# Patient Record
Sex: Male | Born: 1950 | Race: White | Hispanic: No | Marital: Married | Smoking: Former smoker
Health system: Southern US, Community
[De-identification: ages and names within clinical notes are randomized; demographics above are authoritative.]

## PROBLEM LIST (undated history)

## (undated) DIAGNOSIS — C4490 Unspecified malignant neoplasm of skin, unspecified: Secondary | ICD-10-CM

## (undated) DIAGNOSIS — I10 Essential (primary) hypertension: Secondary | ICD-10-CM

## (undated) DIAGNOSIS — N200 Calculus of kidney: Secondary | ICD-10-CM

## (undated) DIAGNOSIS — F419 Anxiety disorder, unspecified: Secondary | ICD-10-CM

## (undated) HISTORY — PX: OTHER SURGICAL HISTORY: SHX169

## (undated) HISTORY — DX: Unspecified malignant neoplasm of skin, unspecified: C44.90

## (undated) HISTORY — PX: NASAL FRACTURE SURGERY: SHX718

## (undated) HISTORY — PX: TONSILLECTOMY: SUR1361

## (undated) HISTORY — PX: APPENDECTOMY: SHX54

## (undated) HISTORY — PX: CHOLECYSTECTOMY: SHX55

---

## 2004-10-24 ENCOUNTER — Other Ambulatory Visit: Payer: Self-pay

## 2004-11-01 ENCOUNTER — Ambulatory Visit: Payer: Self-pay | Admitting: Unknown Physician Specialty

## 2010-06-24 ENCOUNTER — Emergency Department: Payer: Self-pay | Admitting: Emergency Medicine

## 2011-12-17 ENCOUNTER — Ambulatory Visit: Payer: Self-pay | Admitting: Internal Medicine

## 2012-02-18 ENCOUNTER — Emergency Department: Payer: Self-pay

## 2012-02-18 LAB — URINALYSIS, COMPLETE
Bilirubin,UR: NEGATIVE
Blood: NEGATIVE
Glucose,UR: NEGATIVE mg/dL (ref 0–75)
Leukocyte Esterase: NEGATIVE
Ph: 6 (ref 4.5–8.0)
Protein: NEGATIVE
Squamous Epithelial: NONE SEEN
WBC UR: <1 /HPF (ref 0–5)

## 2012-02-18 LAB — CBC
HCT: 50.7 % (ref 40.0–52.0)
MCHC: 34.2 g/dL (ref 32.0–36.0)
RBC: 5.58 10*6/uL (ref 4.40–5.90)
RDW: 12.6 % (ref 11.5–14.5)
WBC: 11.9 10*3/uL — ABNORMAL HIGH (ref 3.8–10.6)

## 2012-02-18 LAB — COMPREHENSIVE METABOLIC PANEL
Alkaline Phosphatase: 82 U/L (ref 50–136)
Chloride: 102 mmol/L (ref 98–107)
EGFR (Non-African Amer.): >60
Glucose: 130 mg/dL — ABNORMAL HIGH (ref 65–99)
Potassium: 4.8 mmol/L (ref 3.5–5.1)
SGOT(AST): 39 U/L — ABNORMAL HIGH (ref 15–37)
Total Protein: 8.1 g/dL (ref 6.4–8.2)

## 2016-10-02 ENCOUNTER — Observation Stay
Admission: EM | Admit: 2016-10-02 | Discharge: 2016-10-03 | Disposition: A | Discharge status: Home / Self Care | Payer: 59 | Attending: Internal Medicine | Admitting: Internal Medicine

## 2016-10-02 ENCOUNTER — Emergency Department: Payer: 59

## 2016-10-02 ENCOUNTER — Encounter: Payer: Self-pay | Admitting: Emergency Medicine

## 2016-10-02 DIAGNOSIS — I10 Essential (primary) hypertension: Secondary | ICD-10-CM | POA: Diagnosis not present

## 2016-10-02 DIAGNOSIS — Z87442 Personal history of urinary calculi: Secondary | ICD-10-CM | POA: Diagnosis not present

## 2016-10-02 DIAGNOSIS — K219 Gastro-esophageal reflux disease without esophagitis: Secondary | ICD-10-CM | POA: Diagnosis not present

## 2016-10-02 DIAGNOSIS — F419 Anxiety disorder, unspecified: Secondary | ICD-10-CM | POA: Insufficient documentation

## 2016-10-02 DIAGNOSIS — Z880 Allergy status to penicillin: Secondary | ICD-10-CM | POA: Insufficient documentation

## 2016-10-02 DIAGNOSIS — K5732 Diverticulitis of large intestine without perforation or abscess without bleeding: Secondary | ICD-10-CM | POA: Diagnosis not present

## 2016-10-02 DIAGNOSIS — F329 Major depressive disorder, single episode, unspecified: Secondary | ICD-10-CM | POA: Diagnosis not present

## 2016-10-02 DIAGNOSIS — K5792 Diverticulitis of intestine, part unspecified, without perforation or abscess without bleeding: Secondary | ICD-10-CM | POA: Diagnosis present

## 2016-10-02 HISTORY — DX: Essential (primary) hypertension: I10

## 2016-10-02 HISTORY — DX: Anxiety disorder, unspecified: F41.9

## 2016-10-02 HISTORY — DX: Calculus of kidney: N20.0

## 2016-10-02 LAB — CBC WITH DIFFERENTIAL/PLATELET
BASOS ABS: 0.1 10*3/uL (ref 0–0.1)
Basophils Relative: 0 %
EOS PCT: 1 %
Eosinophils Absolute: 0.1 10*3/uL (ref 0–0.7)
HCT: 44.1 % (ref 40.0–52.0)
HEMOGLOBIN: 15.2 g/dL (ref 13.0–18.0)
LYMPHS ABS: 1.7 10*3/uL (ref 1.0–3.6)
LYMPHS PCT: 12 %
MCH: 30.8 pg (ref 26.0–34.0)
MCHC: 34.5 g/dL (ref 32.0–36.0)
MCV: 89.3 fL (ref 80.0–100.0)
Monocytes Absolute: 1.7 10*3/uL — ABNORMAL HIGH (ref 0.2–1.0)
Monocytes Relative: 12 %
NEUTROS ABS: 10.3 10*3/uL — AB (ref 1.4–6.5)
NEUTROS PCT: 75 %
PLATELETS: 218 10*3/uL (ref 150–440)
RBC: 4.94 MIL/uL (ref 4.40–5.90)
RDW: 12.6 % (ref 11.5–14.5)
WBC: 13.7 10*3/uL — AB (ref 3.8–10.6)

## 2016-10-02 LAB — BASIC METABOLIC PANEL
ANION GAP: 9 (ref 5–15)
BUN: 21 mg/dL — ABNORMAL HIGH (ref 6–20)
CHLORIDE: 102 mmol/L (ref 101–111)
CO2: 24 mmol/L (ref 22–32)
Calcium: 9.3 mg/dL (ref 8.9–10.3)
Creatinine, Ser: 1.15 mg/dL (ref 0.61–1.24)
GFR calc Af Amer: >60 mL/min (ref >60)
GLUCOSE: 114 mg/dL — AB (ref 65–99)
POTASSIUM: 3.6 mmol/L (ref 3.5–5.1)
Sodium: 135 mmol/L (ref 135–145)

## 2016-10-02 LAB — URINALYSIS COMPLETE WITH MICROSCOPIC (ARMC ONLY)
BACTERIA UA: NONE SEEN
Bilirubin Urine: NEGATIVE
GLUCOSE, UA: 150 mg/dL — AB
Hgb urine dipstick: NEGATIVE
Leukocytes, UA: NEGATIVE
Nitrite: NEGATIVE
PROTEIN: 30 mg/dL — AB
SPECIFIC GRAVITY, URINE: 1.024 (ref 1.005–1.030)
pH: 7 (ref 5.0–8.0)

## 2016-10-02 MED ORDER — MORPHINE SULFATE (PF) 4 MG/ML IV SOLN
4.0000 mg | Freq: Once | INTRAVENOUS | Status: AC
  Administered 2016-10-02: 4 mg via INTRAVENOUS
  Filled 2016-10-02: qty 1

## 2016-10-02 MED ORDER — ONDANSETRON HCL 4 MG/2ML IJ SOLN
INTRAMUSCULAR | Status: AC
  Administered 2016-10-02: 4 mg via INTRAVENOUS
  Filled 2016-10-02: qty 2

## 2016-10-02 MED ORDER — PAROXETINE HCL 20 MG PO TABS
30.0000 mg | ORAL_TABLET | Freq: Every day | ORAL | Status: DC
  Administered 2016-10-03: 09:00:00 30 mg via ORAL
  Filled 2016-10-02: qty 2

## 2016-10-02 MED ORDER — SODIUM CHLORIDE 0.9 % IV BOLUS (SEPSIS)
500.0000 mL | Freq: Once | INTRAVENOUS | Status: AC
Start: 2016-10-02 — End: 2016-10-02
  Administered 2016-10-02: 500 mL via INTRAVENOUS

## 2016-10-02 MED ORDER — ACETAMINOPHEN 650 MG RE SUPP: 650.0000 mg | Freq: Four times a day (QID) | RECTAL | Status: DC | PRN

## 2016-10-02 MED ORDER — ONDANSETRON HCL 4 MG/2ML IJ SOLN: 4.0000 mg | Freq: Four times a day (QID) | INTRAMUSCULAR | Status: DC | PRN

## 2016-10-02 MED ORDER — ONDANSETRON HCL 4 MG PO TABS: 4.0000 mg | ORAL_TABLET | Freq: Four times a day (QID) | ORAL | Status: DC | PRN

## 2016-10-02 MED ORDER — SODIUM CHLORIDE 0.9 % IV SOLN
INTRAVENOUS | Status: DC
  Administered 2016-10-02: via INTRAVENOUS

## 2016-10-02 MED ORDER — ACETAMINOPHEN 325 MG PO TABS
650.0000 mg | ORAL_TABLET | Freq: Four times a day (QID) | ORAL | Status: DC | PRN
  Administered 2016-10-03: 650 mg via ORAL
  Filled 2016-10-02: qty 2

## 2016-10-02 MED ORDER — SODIUM CHLORIDE 0.9 % IV BOLUS (SEPSIS)
500.0000 mL | Freq: Once | INTRAVENOUS | Status: AC
  Administered 2016-10-02: 500 mL via INTRAVENOUS

## 2016-10-02 MED ORDER — PANTOPRAZOLE SODIUM 40 MG PO TBEC
40.0000 mg | DELAYED_RELEASE_TABLET | Freq: Every day | ORAL | Status: DC
  Administered 2016-10-03: 09:00:00 40 mg via ORAL
  Filled 2016-10-02: qty 1

## 2016-10-02 MED ORDER — CIPROFLOXACIN IN D5W 400 MG/200ML IV SOLN
400.0000 mg | Freq: Two times a day (BID) | INTRAVENOUS | Status: DC
  Filled 2016-10-02 (×2): qty 200

## 2016-10-02 MED ORDER — MORPHINE SULFATE (PF) 4 MG/ML IV SOLN: 4.0000 mg | Freq: Once | INTRAVENOUS | Status: DC

## 2016-10-02 MED ORDER — ONDANSETRON HCL 4 MG/2ML IJ SOLN
4.0000 mg | Freq: Once | INTRAMUSCULAR | Status: AC
  Administered 2016-10-02: 4 mg via INTRAVENOUS

## 2016-10-02 MED ORDER — ENOXAPARIN SODIUM 40 MG/0.4ML ~~LOC~~ SOLN: 40.0000 mg | SUBCUTANEOUS | Status: DC

## 2016-10-02 MED ORDER — METRONIDAZOLE IN NACL 5-0.79 MG/ML-% IV SOLN
500.0000 mg | Freq: Three times a day (TID) | INTRAVENOUS | Status: DC
  Administered 2016-10-03: 05:00:00 500 mg via INTRAVENOUS
  Filled 2016-10-02 (×3): qty 100

## 2016-10-02 MED ORDER — IRBESARTAN 150 MG PO TABS
150.0000 mg | ORAL_TABLET | Freq: Every day | ORAL | Status: DC
  Administered 2016-10-03: 09:00:00 150 mg via ORAL
  Filled 2016-10-02: qty 1

## 2016-10-02 MED ORDER — METRONIDAZOLE IN NACL 5-0.79 MG/ML-% IV SOLN
500.0000 mg | Freq: Once | INTRAVENOUS | Status: AC
  Administered 2016-10-02: 500 mg via INTRAVENOUS
  Filled 2016-10-02: qty 100

## 2016-10-02 MED ORDER — KETOROLAC TROMETHAMINE 30 MG/ML IJ SOLN
15.0000 mg | Freq: Once | INTRAMUSCULAR | Status: AC
  Administered 2016-10-02: 15 mg via INTRAVENOUS
  Filled 2016-10-02: qty 1

## 2016-10-02 MED ORDER — MORPHINE SULFATE (PF) 4 MG/ML IV SOLN: 2.0000 mg | INTRAVENOUS | Status: DC | PRN

## 2016-10-02 MED ORDER — PHENAZOPYRIDINE HCL 200 MG PO TABS
200.0000 mg | ORAL_TABLET | Freq: Once | ORAL | Status: AC
  Administered 2016-10-02: 200 mg via ORAL
  Filled 2016-10-02: qty 1

## 2016-10-02 MED ORDER — OXYCODONE HCL 5 MG PO TABS: 5.0000 mg | ORAL_TABLET | ORAL | Status: DC | PRN

## 2016-10-02 MED ORDER — CIPROFLOXACIN IN D5W 400 MG/200ML IV SOLN
400.0000 mg | Freq: Once | INTRAVENOUS | Status: AC
  Administered 2016-10-02: 400 mg via INTRAVENOUS
  Filled 2016-10-02: qty 200

## 2016-10-02 NOTE — ED Triage Notes (Signed)
Past a kidney stone yesterday , fever 102 today , dysuria, bladder spasms today, " it made me double over"

## 2016-10-02 NOTE — ED Notes (Signed)
Pt lying on stretcher, mouth breathing, tachypneic.   Tammy EDT performing EKG.   Pt passed kidney stone yesterday, denies blood in urine. States hurts to pee and having bladder spasm, had fever at home. Pt tensing up while talking and moaning.

## 2016-10-02 NOTE — ED Provider Notes (Signed)
[facility] Emergency Department Provider Note  ____________________________________________   I have reviewed the triage vital signs and the nursing notes.   HISTORY  Chief Complaint Dysuria    HPI Mason Rodgers is a 65 y.o. male presents today complaining of left-sided and suprapubic abdominal pain. Patient believes this is because of a kidney stone. Patient states he believes he may have passed a kidney stone the day before yesterday. Since that time he has had a gradually worsening abdominal pain. No fever, positive nausea but no vomiting. He has not had any significant dysuria but he has when he wheezes pain around his bladder. He is not grossly retaining. At her scan here shows less than 200 cc.      Past Medical History:  Diagnosis Date  . Anxiety   . Cancer ([place])   . Hypertension   . Kidney stone     There are no active problems to display for this patient.   Past Surgical History:  Procedure Laterality Date  . APPENDECTOMY    . CHOLECYSTECTOMY      Prior to Admission medications   Not on File    Allergies Penicillins  No family history on file.  Social History Social History  Substance Use Topics  . Smoking status: Former Research scientist (life sciences)  . Smokeless tobacco: Never Used  . Alcohol use Yes    Review of Systems Constitutional: No fever/chills Eyes: No visual changes. ENT: No sore throat. No stiff neck no neck pain Cardiovascular: Denies chest pain. Respiratory: Denies shortness of breath. Gastrointestinal:   no vomiting.  No diarrhea.  No constipation. Genitourinary: Negative for dysuria. Musculoskeletal: Negative lower extremity swelling Skin: Negative for rash. Neurological: Negative for severe headaches, focal weakness or numbness. 10-point ROS otherwise negative.  ____________________________________________   PHYSICAL EXAM:  VITAL SIGNS: ED Triage Vitals  Enc Vitals Group     BP 10/02/16 1813 (!) 143/74     Pulse  Rate 10/02/16 1813 (!) 111     Resp 10/02/16 1813 18     Temp 10/02/16 1813 99.5 F (37.5 C)     Temp Source 10/02/16 1813 Oral     SpO2 10/02/16 1813 100 %     Weight 10/02/16 1814 171 lb (77.6 kg)     Height 10/02/16 1814 5\' 6"  (1.676 m)     Head Circumference --      Peak Flow --      Pain Score 10/02/16 1814 8     Pain Loc --      Pain Edu? --      Excl. in [place]? --     Constitutional: Alert and oriented. Well appearing and in no acute distress.Ears somewhat uncomfortable Eyes: Conjunctivae are normal. PERRL. EOMI. Head: Atraumatic. Nose: No congestion/rhinnorhea. Mouth/Throat: Mucous membranes are moist.  Oropharynx non-erythematous. Neck: No stridor.   Nontender with no meningismus Cardiovascular: Normal rate, regular rhythm. Grossly normal heart sounds.  Good peripheral circulation. Respiratory: Normal respiratory effort.  No retractions. Lungs CTAB. Abdominal: Soft and Diffusely tender with tenderness to palpation suprapubic and the left lower quadrant. No distention. No guarding no rebound Back:  There is no focal tenderness or step off.  there is no midline tenderness there are no lesions noted. there is no CVA tenderness Musculoskeletal: No lower extremity tenderness, no upper extremity tenderness. No joint effusions, no DVT signs strong distal pulses no edema Neurologic:  Normal speech and language. No gross focal neurologic deficits are appreciated.  Skin:  Skin is warm,  dry and intact. No rash noted. Psychiatric: Mood and affect are normal. Speech and behavior are normal.  ____________________________________________   LABS (all labs ordered are listed, but only abnormal results are displayed)  Labs Reviewed  CBC WITH DIFFERENTIAL/PLATELET - Abnormal; Notable for the following:       Result Value   WBC 13.7 (*)    Neutro Abs 10.3 (*)    Monocytes Absolute 1.7 (*)    All other components within normal limits  BASIC METABOLIC PANEL - Abnormal; Notable for the  following:    Glucose, Bld 114 (*)    BUN 21 (*)    All other components within normal limits  URINE CULTURE  URINALYSIS COMPLETEWITH MICROSCOPIC ([facility] ONLY)   ____________________________________________  EKG  I personally interpreted any EKGs ordered by me or triage Sinus tach rate 109 no acute ST elevation or depression normal axis ____________________________________________  RADIOLOGY  I reviewed any imaging ordered by me or triage that were performed during my shift and, if possible, patient and/or family made aware of any abnormal findings. ____________________________________________   PROCEDURES  Procedure(s) performed: None  Procedures  Critical Care performed: None  ____________________________________________   INITIAL IMPRESSION / ASSESSMENT AND PLAN / ED COURSE  Pertinent labs & imaging results that were available during my care of the patient were reviewed by me and considered in my medical decision making (see chart for details).  Was adamantly convinced that this was a sequelae of a kidney stone however, his exam was somewhat on usual for that, urinalysis is pending CT scan however does show acute diverticulitis. We will treat him with antibiotics, no evidence of perforation radiology no free air. We will continue to give him pain medication. We did initially give him Toradol as he had a very strong history of kidney stone and he felt much better. However, we will transition to more narcotic pain medication for this as he is still somewhat uncomfortable. The morphine did help his pain initially however.  Clinical Course    ____________________________________________   FINAL CLINICAL IMPRESSION(S) / ED DIAGNOSES  Final diagnoses:  None      This chart was dictated using voice recognition software.  Despite best efforts to proofread,  errors can occur which can change meaning.      Schuyler Amor, MD 10/02/16 2032

## 2016-10-02 NOTE — H&P (Signed)
[place] [facility] [place] NAME: Janet Szwed    MR#:  [id number]  DATE OF BIRTH:  12/12/1950   DATE OF ADMISSION:  10/02/2016  PRIMARY CARE PHYSICIAN: Madelyn Brunner, MD   REQUESTING/REFERRING PHYSICIAN: Burlene Arnt  CHIEF COMPLAINT:   Flank pain  HISTORY OF PRESENT ILLNESS:  Quency Kitzinger  is a 65 y.o. male with a known history of Essential hypertension who is presenting with flank pain. He states he passed a kidney stone yesterday but pain has been worsening thus presented to Hospital further workup and evaluation. He describes left lower quadrant pain described only as "pain" intensity 7/10 no worsening or relieving factors no radiation associated with some nauseousness denies any vomiting diarrhea [place] for chills and subjective fever.  PAST MEDICAL HISTORY:   Past Medical History:  Diagnosis Date  . Anxiety   . Cancer ([place])   . Hypertension   . Kidney stone     PAST SURGICAL HISTORY:   Past Surgical History:  Procedure Laterality Date  . APPENDECTOMY    . CHOLECYSTECTOMY      SOCIAL HISTORY:   Social History  Substance Use Topics  . Smoking status: Former Research scientist (life sciences)  . Smokeless tobacco: Never Used  . Alcohol use Yes    FAMILY HISTORY:   Family History  Problem Relation Age of Onset  . Hypertension Other     DRUG ALLERGIES:   Allergies  Allergen Reactions  . Penicillins Anaphylaxis    Has patient had a PCN reaction causing immediate rash, facial/tongue/throat swelling, SOB or lightheadedness with hypotension: Yes Has patient had a PCN reaction causing severe rash involving mucus membranes or skin necrosis: No Has patient had a PCN reaction that required hospitalization Yes Has patient had a PCN reaction occurring within the last 10 years: No If all of the above answers are "NO", then may proceed with Cephalosporin use.     REVIEW OF SYSTEMS:  REVIEW OF SYSTEMS:  CONSTITUTIONAL: Positive fevers, chills,  fatigue, weakness.  EYES: Denies blurred vision, double vision, or eye pain.  EARS, NOSE, THROAT: Denies tinnitus, ear pain, hearing loss.  RESPIRATORY: denies cough, shortness of breath, wheezing  CARDIOVASCULAR: Denies chest pain, palpitations, edema.  GASTROINTESTINAL: Positive nausea, abdominal pain.  GENITOURINARY: Denies dysuria, hematuria.  ENDOCRINE: Denies nocturia or thyroid problems. HEMATOLOGIC AND LYMPHATIC: Denies easy bruising or bleeding.  SKIN: Denies rash or lesions.  MUSCULOSKELETAL: Denies pain in neck, back, shoulder, knees, hips, or further arthritic symptoms.  NEUROLOGIC: Denies paralysis, paresthesias.  PSYCHIATRIC: Denies anxiety or depressive symptoms. Otherwise full review of systems performed by me is negative.   MEDICATIONS AT HOME:   Prior to Admission medications   Medication Sig Start Date End Date Taking? Authorizing Provider  ibuprofen (ADVIL,MOTRIN) 200 MG tablet Take 600 mg by mouth 2 (two) times daily.   Yes Historical Provider, MD  olmesartan (BENICAR) 20 MG tablet Take 20 mg by mouth daily.   Yes Historical Provider, MD  omeprazole (PRILOSEC) 20 MG capsule Take 20 mg by mouth daily.   Yes Historical Provider, MD  PARoxetine (PAXIL) 30 MG tablet Take 30 mg by mouth daily.   Yes Historical Provider, MD      VITAL SIGNS:  Blood pressure (!) 143/86, pulse (!) 112, temperature 99.5 F (37.5 C), temperature source Oral, resp. rate (!) 21, height 5\' 6"  (1.676 m), weight 77.6 kg (171 lb), SpO2 100 %.  PHYSICAL EXAMINATION:  VITAL SIGNS: Vitals:   10/02/16 1900 10/02/16  1930  BP: 130/79 (!) 143/86  Pulse: 96 (!) 112  Resp: (!) 21 (!) 21  Temp:     GENERAL:64 y.o.male currently in no acute distress.  HEAD: Normocephalic, atraumatic.  EYES: Pupils equal, round, reactive to light. Extraocular muscles intact. No scleral icterus.  MOUTH: Moist mucosal membrane. Dentition intact. No abscess noted.  EAR, NOSE, THROAT: Clear without exudates. No  external lesions.  NECK: Supple. No thyromegaly. No nodules. No JVD.  PULMONARY: Clear to ascultation, without wheeze rails or rhonci. No use of accessory muscles, Good respiratory effort. good air entry bilaterally CHEST: Nontender to palpation.  CARDIOVASCULAR: S1 and S2. Regular rate and rhythm. No murmurs, rubs, or gallops. No edema. Pedal pulses 2+ bilaterally.  GASTROINTESTINAL: Soft, Positive left lower quadrant tenderness without rebound, nondistended. No masses. Positive bowel sounds. No hepatosplenomegaly.  MUSCULOSKELETAL: No swelling, clubbing, or edema. Range of motion full in all extremities.  NEUROLOGIC: Cranial nerves II through XII are intact. No gross focal neurological deficits. Sensation intact. Reflexes intact.  SKIN: No ulceration, lesions, rashes, or cyanosis. Skin warm and dry. Turgor intact.  PSYCHIATRIC: Mood, affect within normal limits. The patient is awake, alert and oriented x 3. Insight, judgment intact.    LABORATORY PANEL:   CBC  Recent Labs Lab 10/02/16 1838  WBC 13.7*  HGB 15.2  HCT 44.1  PLT 218   ------------------------------------------------------------------------------------------------------------------  Chemistries   Recent Labs Lab 10/02/16 1838  NA 135  K 3.6  CL 102  CO2 24  GLUCOSE 114*  BUN 21*  CREATININE 1.15  CALCIUM 9.3   ------------------------------------------------------------------------------------------------------------------  Cardiac Enzymes No results for input(s): TROPONINI in the last 168 hours. ------------------------------------------------------------------------------------------------------------------  RADIOLOGY:  Ct Renal Stone Study  Result Date: 10/02/2016 CLINICAL DATA:  Initial evaluation for kidney stones. EXAM: CT ABDOMEN AND PELVIS WITHOUT CONTRAST TECHNIQUE: Multidetector CT imaging of the abdomen and pelvis was performed following the standard protocol without IV contrast. COMPARISON:   Prior CT from 12/17/2011. FINDINGS: Lower chest: Mild subsegmental atelectasis seen dependently within the visualized lung bases. Visualized lungs are otherwise clear. 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 Hepatobiliary: The liver demonstrates a normal unenhanced appearance. Gallbladder surgically absent. No biliary dilatation. Pancreas: Pancreas within normal limits. Spleen: Subcentimeter hypodensity noted within the anterior aspect of the spleen, not well characterized on this noncontrast examination, but of doubtful clinical significance. Spleen otherwise unremarkable. Adrenals/Urinary Tract: Adrenal glands are normal. Few scattered punctate calcific densities present within the kidneys bilaterally, suspicious for small nonobstructive calculi. No radiopaque stones present along the course of either ureter. There is no hydroureter. Small bilateral diverticula noted at the UVJs bilaterally. No radiopaque stones seen within the bladder lumen. Bladder itself within normal limits. Stomach/Bowel: Large hiatal hernia without associated inflammation. Stomach otherwise unremarkable. No evidence for bowel obstruction. Multiple sigmoid diverticula present. There is inflammatory stranding about several of these diverticular within the central/ left lower abdomen, compatible with acute diverticulitis. No other acute inflammatory changes seen about the bowels. Appendix not visualize, compatible with prior appendectomy. Vascular/Lymphatic: Aorta of normal caliber. Minimal aorto bi-iliac atherosclerotic disease. No adenopathy. Reproductive: Prostate normal. Other: No free intraperitoneal air. No free fluid. Small bilateral fat containing inguinal hernias noted. Musculoskeletal: No acute osseous abnormality. No worrisome lytic or blastic osseous lesions. IMPRESSION: 1. Findings consistent with acute sigmoid diverticulitis. No complication identified. 2. Probable small punctate nonobstructive nephrolithiasis  bilaterally. No CT evidence for obstructive uropathy. 3. No other acute intra-abdominal or pelvic process identified. Electronically Signed   By:  Jeannine Boga M.D.   On: 10/02/2016 20:18    EKG:   Orders placed or performed during the hospital encounter of 10/02/16  . EKG 12-Lead  . EKG 12-Lead    IMPRESSION AND PLAN:   65 year old Caucasian gentleman history of essential hypertension who is presenting with flank/abdominal pain.  1. Acute diverticulitis: IV fluid hydration, antibiotic ciprofloxacin/Flagyl, diet as tolerated 2. Essential hypertension Benicar 3. GERD without esophagitis PPI therapy Anxiety/depression not otherwise specified Paxil    All the records are reviewed and case discussed with ED provider. Management plans discussed with the patient, family and they are in agreement.  CODE STATUS: Full  TOTAL TIME TAKING CARE OF THIS PATIENT: 33 minutes.    Hower,  ELIJIO [email or web address].D on 10/02/2016 at 9:05 PM  Between 7am to 6pm - Pager - [phone]  After 6pm: House Pager: - [facility]  Office  [phone]  CC: Primary care physician; Madelyn Brunner, MD

## 2016-10-02 NOTE — ED Notes (Signed)
Pt urinated and states pain in penis and bladder still after urinating.

## 2016-10-02 NOTE — ED Notes (Signed)
Dr. Hower at bedside.  

## 2016-10-02 NOTE — ED Notes (Signed)
266ml on bladder scan, urinated 168ml

## 2016-10-03 DIAGNOSIS — K5792 Diverticulitis of intestine, part unspecified, without perforation or abscess without bleeding: Secondary | ICD-10-CM | POA: Diagnosis present

## 2016-10-03 LAB — BASIC METABOLIC PANEL
Anion gap: 7 (ref 5–15)
BUN: 19 mg/dL (ref 6–20)
CALCIUM: 7.8 mg/dL — AB (ref 8.9–10.3)
CHLORIDE: 105 mmol/L (ref 101–111)
CO2: 25 mmol/L (ref 22–32)
CREATININE: 1.06 mg/dL (ref 0.61–1.24)
GFR calc Af Amer: >60 mL/min (ref >60)
GFR calc non Af Amer: >60 mL/min (ref >60)
Glucose, Bld: 165 mg/dL — ABNORMAL HIGH (ref 65–99)
Potassium: 4 mmol/L (ref 3.5–5.1)
SODIUM: 137 mmol/L (ref 135–145)

## 2016-10-03 LAB — CBC
HCT: 40.8 % (ref 40.0–52.0)
Hemoglobin: 13.8 g/dL (ref 13.0–18.0)
MCH: 30.9 pg (ref 26.0–34.0)
MCHC: 33.8 g/dL (ref 32.0–36.0)
MCV: 91.6 fL (ref 80.0–100.0)
PLATELETS: 182 10*3/uL (ref 150–440)
RBC: 4.46 MIL/uL (ref 4.40–5.90)
RDW: 12.8 % (ref 11.5–14.5)
WBC: 14.1 10*3/uL — AB (ref 3.8–10.6)

## 2016-10-03 MED ORDER — METRONIDAZOLE 500 MG PO TABS: 500.0000 mg | ORAL_TABLET | Freq: Three times a day (TID) | ORAL | 0 refills | Status: DC

## 2016-10-03 MED ORDER — METRONIDAZOLE 500 MG PO TABS: 500.0000 mg | ORAL_TABLET | Freq: Three times a day (TID) | ORAL | Status: DC

## 2016-10-03 MED ORDER — CIPROFLOXACIN HCL 500 MG PO TABS: 500.0000 mg | ORAL_TABLET | Freq: Two times a day (BID) | ORAL | 0 refills | Status: DC

## 2016-10-03 MED ORDER — CIPROFLOXACIN HCL 500 MG PO TABS
500.0000 mg | ORAL_TABLET | Freq: Two times a day (BID) | ORAL | Status: DC
  Administered 2016-10-03: 500 mg via ORAL
  Filled 2016-10-03: qty 1

## 2016-10-03 NOTE — Discharge Instructions (Signed)
Soft easy to digest diet.   

## 2016-10-03 NOTE — Progress Notes (Signed)
Pt is being discharged today, discharge instructions given to pt, 2 paper prescriptions given to him. He verified understanding of all information. Pt belongings packed and returned to pt. He was rolled out in wheelchair by staff.

## 2016-10-03 NOTE — Discharge Summary (Signed)
[facility] [place] NAME: Mason Rodgers    MR#:  [id number]  DATE OF BIRTH:  02/02/1951  DATE OF ADMISSION:  10/02/2016 ADMITTING PHYSICIAN: Lytle Butte, MD  DATE OF DISCHARGE: 10/03/16  PRIMARY CARE PHYSICIAN: Madelyn Brunner, MD    ADMISSION DIAGNOSIS:  Diverticulitis of large intestine without perforation or abscess without bleeding [K57.32]  DISCHARGE DIAGNOSIS:  Acute Sigmoid Diverticulitis  SECONDARY DIAGNOSIS:   Past Medical History:  Diagnosis Date  . Anxiety   . Cancer ([place])   . Hypertension   . Kidney stone     HOSPITAL COURSE:  Mason Rodgers  is a 66 y.o. male with a known history of Essential hypertension who is presenting with flank pain. He states he passed a kidney stone yesterday but pain has been worsening thus presented to Hospital further workup and evaluation. He describes left lower quadrant pain described only as "pain" intensity 7/10 no worsening or relieving factors no radiation associated with some nauseousness denies any vomiting diarrhea   1. Acute Sigmoid diverticulitis:  -received IV fluid hydration, antibiotic ciprofloxacin/Flagyl, diet as tolerated -change to po abxs. Feels better than y'day. Tolerating po diet -change to po abxs total 10 days  2. Essential hypertension Benicar  3. GERD without esophagitis PPI therapy  4.Anxiety/depression not otherwise specified Paxil  Overall better. Pt agrees he will feel better at home.   CONSULTS OBTAINED:  Treatment Team:  Lytle Butte, MD  DRUG ALLERGIES:   Allergies  Allergen Reactions  . Penicillins Anaphylaxis    Has patient had a PCN reaction causing immediate rash, facial/tongue/throat swelling, SOB or lightheadedness with hypotension: Yes Has patient had a PCN reaction causing severe rash involving mucus membranes or skin necrosis: No Has patient had a PCN reaction that required hospitalization Yes Has patient had a PCN  reaction occurring within the last 10 years: No If all of the above answers are "NO", then may proceed with Cephalosporin use.     DISCHARGE MEDICATIONS:   Current Discharge Medication List    START taking these medications   Details  ciprofloxacin (CIPRO) 500 MG tablet Take 1 tablet (500 mg total) by mouth 2 (two) times daily. Qty: 18 tablet, Refills: 0    metroNIDAZOLE (FLAGYL) 500 MG tablet Take 1 tablet (500 mg total) by mouth every 8 (eight) hours. Qty: 27 tablet, Refills: 0      CONTINUE these medications which have NOT CHANGED   Details  ibuprofen (ADVIL,MOTRIN) 200 MG tablet Take 600 mg by mouth 2 (two) times daily.    olmesartan (BENICAR) 20 MG tablet Take 20 mg by mouth daily.    omeprazole (PRILOSEC) 20 MG capsule Take 20 mg by mouth daily.    PARoxetine (PAXIL) 30 MG tablet Take 30 mg by mouth daily.        If you experience worsening of your admission symptoms, develop shortness of breath, life threatening emergency, suicidal or homicidal thoughts you must seek medical attention immediately by calling 911 or calling your MD immediately  if symptoms less severe.  You Must read complete instructions/literature along with all the possible adverse reactions/side effects for all the Medicines you take and that have been prescribed to you. Take any new Medicines after you have completely understood and accept all the possible adverse reactions/side effects.   Please note  You were cared for by a hospitalist during your hospital stay. If you have any questions about your discharge medications or  the care you received while you were in the hospital after you are discharged, you can call the unit and asked to speak with the hospitalist on call if the hospitalist that took care of you is not available. Once you are discharged, your primary care physician will handle any further medical issues. Please note that NO REFILLS for any discharge medications will be authorized once  you are discharged, as it is imperative that you return to your primary care physician (or establish a relationship with a primary care physician if you do not have one) for your aftercare needs so that they can reassess your need for medications and monitor your lab values. Today   SUBJECTIVE   I feel better than y'day  VITAL SIGNS:  Blood pressure 107/62, pulse 96, temperature 99.3 F (37.4 C), temperature source Oral, resp. rate 20, height 5\' 6"  (1.676 m), weight 77.6 kg (171 lb), SpO2 97 %.  I/O:   Intake/Output Summary (Last 24 hours) at 10/03/16 0906 Last data filed at 10/03/16 0636  Gross per 24 hour  Intake           573.33 ml  Output              450 ml  Net           123.33 ml    PHYSICAL EXAMINATION:  GENERAL:  65 y.o.-year-old patient lying in the bed with no acute distress.  EYES: Pupils equal, round, reactive to light and accommodation. No scleral icterus. Extraocular muscles intact.  HEENT: Head atraumatic, normocephalic. Oropharynx and nasopharynx clear.  NECK:  Supple, no jugular venous distention. No thyroid enlargement, no tenderness.  LUNGS: Normal breath sounds bilaterally, no wheezing, rales,rhonchi or crepitation. No use of accessory muscles of respiration.  CARDIOVASCULAR: S1, S2 normal. No murmurs, rubs, or gallops.  ABDOMEN: Soft, non-tender, non-distended. Bowel sounds present. No organomegaly or mass.  EXTREMITIES: No pedal edema, cyanosis, or clubbing.  NEUROLOGIC: Cranial nerves II through XII are intact. Muscle strength 5/5 in all extremities. Sensation intact. Gait not checked.  PSYCHIATRIC: The patient is alert and oriented x 3.  SKIN: No obvious rash, lesion, or ulcer.   DATA REVIEW:   CBC   Recent Labs Lab 10/03/16 0332  WBC 14.1*  HGB 13.8  HCT 40.8  PLT 182    Chemistries   Recent Labs Lab 10/03/16 0332  NA 137  K 4.0  CL 105  CO2 25  GLUCOSE 165*  BUN 19  CREATININE 1.06  CALCIUM 7.8*    Microbiology Results   No  results found for this or any previous visit (from the past 240 hour(s)).  RADIOLOGY:  Ct Renal Stone Study  Result Date: 10/02/2016 CLINICAL DATA:  Initial evaluation for kidney stones. EXAM: CT ABDOMEN AND PELVIS WITHOUT CONTRAST TECHNIQUE: Multidetector CT imaging of the abdomen and pelvis was performed following the standard protocol without IV contrast. COMPARISON:  Prior CT from 12/17/2011. FINDINGS: Lower chest: Mild subsegmental atelectasis seen dependently within the visualized lung bases. Visualized lungs are otherwise clear. 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 Hepatobiliary: The liver demonstrates a normal unenhanced appearance. Gallbladder surgically absent. No biliary dilatation. Pancreas: Pancreas within normal limits. Spleen: Subcentimeter hypodensity noted within the anterior aspect of the spleen, not well characterized on this noncontrast examination, but of doubtful clinical significance. Spleen otherwise unremarkable. Adrenals/Urinary Tract: Adrenal glands are normal. Few scattered punctate calcific densities present within the kidneys bilaterally, suspicious for small nonobstructive calculi. No radiopaque stones present  along the course of either ureter. There is no hydroureter. Small bilateral diverticula noted at the UVJs bilaterally. No radiopaque stones seen within the bladder lumen. Bladder itself within normal limits. Stomach/Bowel: Large hiatal hernia without associated inflammation. Stomach otherwise unremarkable. No evidence for bowel obstruction. Multiple sigmoid diverticula present. There is inflammatory stranding about several of these diverticular within the central/ left lower abdomen, compatible with acute diverticulitis. No other acute inflammatory changes seen about the bowels. Appendix not visualize, compatible with prior appendectomy. Vascular/Lymphatic: Aorta of normal caliber. Minimal aorto bi-iliac atherosclerotic disease. No adenopathy. Reproductive:  Prostate normal. Other: No free intraperitoneal air. No free fluid. Small bilateral fat containing inguinal hernias noted. Musculoskeletal: No acute osseous abnormality. No worrisome lytic or blastic osseous lesions. IMPRESSION: 1. Findings consistent with acute sigmoid diverticulitis. No complication identified. 2. Probable small punctate nonobstructive nephrolithiasis bilaterally. No CT evidence for obstructive uropathy. 3. No other acute intra-abdominal or pelvic process identified. Electronically Signed   By: Jeannine Boga M.D.   On: 10/02/2016 20:18     Management plans discussed with the patient, family and they are in agreement.  CODE STATUS:     Code Status Orders        Start     Ordered   10/02/16 2041  Full code  Continuous     10/02/16 2041    Code Status History    Date Active Date Inactive Code Status Order ID Comments User Context   This patient has a current code status but no historical code status.      TOTAL TIME TAKING CARE OF THIS PATIENT: 40 minutes.    Roben Schliep M.D on 10/03/2016 at 9:06 AM  Between 7am to 6pm - Pager - [phone] After 6pm go to [email or web address] - password [email or web address] [facility]  Office  [phone]  CC: Primary care physician; Madelyn Brunner, MD

## 2016-10-04 LAB — URINE CULTURE: Culture: NO GROWTH

## 2016-10-05 ENCOUNTER — Emergency Department
Admission: EM | Admit: 2016-10-05 | Discharge: 2016-10-05 | Disposition: A | Discharge status: Home / Self Care | Payer: Managed Care, Other (non HMO) | Attending: Emergency Medicine | Admitting: Emergency Medicine

## 2016-10-05 ENCOUNTER — Encounter: Payer: Self-pay | Admitting: Emergency Medicine

## 2016-10-05 DIAGNOSIS — Z87891 Personal history of nicotine dependence: Secondary | ICD-10-CM | POA: Diagnosis not present

## 2016-10-05 DIAGNOSIS — R112 Nausea with vomiting, unspecified: Secondary | ICD-10-CM | POA: Diagnosis present

## 2016-10-05 DIAGNOSIS — Z79899 Other long term (current) drug therapy: Secondary | ICD-10-CM | POA: Diagnosis not present

## 2016-10-05 DIAGNOSIS — I10 Essential (primary) hypertension: Secondary | ICD-10-CM | POA: Insufficient documentation

## 2016-10-05 DIAGNOSIS — Z859 Personal history of malignant neoplasm, unspecified: Secondary | ICD-10-CM | POA: Insufficient documentation

## 2016-10-05 DIAGNOSIS — R197 Diarrhea, unspecified: Secondary | ICD-10-CM | POA: Diagnosis not present

## 2016-10-05 LAB — COMPREHENSIVE METABOLIC PANEL
ALBUMIN: 3.6 g/dL (ref 3.5–5.0)
ALT: 39 U/L (ref 17–63)
ANION GAP: 8 (ref 5–15)
AST: 25 U/L (ref 15–41)
Alkaline Phosphatase: 52 U/L (ref 38–126)
BUN: 14 mg/dL (ref 6–20)
CHLORIDE: 104 mmol/L (ref 101–111)
CO2: 23 mmol/L (ref 22–32)
Calcium: 8.8 mg/dL — ABNORMAL LOW (ref 8.9–10.3)
Creatinine, Ser: 1.01 mg/dL (ref 0.61–1.24)
GFR calc non Af Amer: >60 mL/min (ref >60)
Glucose, Bld: 142 mg/dL — ABNORMAL HIGH (ref 65–99)
Potassium: 3.8 mmol/L (ref 3.5–5.1)
SODIUM: 135 mmol/L (ref 135–145)
Total Bilirubin: 1.1 mg/dL (ref 0.3–1.2)
Total Protein: 7.4 g/dL (ref 6.5–8.1)

## 2016-10-05 LAB — CBC
HCT: 43.7 % (ref 40.0–52.0)
HEMOGLOBIN: 14.8 g/dL (ref 13.0–18.0)
MCH: 30.2 pg (ref 26.0–34.0)
MCHC: 33.8 g/dL (ref 32.0–36.0)
MCV: 89.3 fL (ref 80.0–100.0)
Platelets: 249 10*3/uL (ref 150–440)
RBC: 4.9 MIL/uL (ref 4.40–5.90)
RDW: 12.8 % (ref 11.5–14.5)
WBC: 11.3 10*3/uL — ABNORMAL HIGH (ref 3.8–10.6)

## 2016-10-05 LAB — URINALYSIS COMPLETE WITH MICROSCOPIC (ARMC ONLY)
BACTERIA UA: NONE SEEN
Bilirubin Urine: NEGATIVE
Glucose, UA: 50 mg/dL — AB
Hgb urine dipstick: NEGATIVE
Nitrite: NEGATIVE
PH: 5 (ref 5.0–8.0)
PROTEIN: NEGATIVE mg/dL
RBC / HPF: NONE SEEN RBC/hpf (ref 0–5)
SQUAMOUS EPITHELIAL / LPF: NONE SEEN
Specific Gravity, Urine: 1.013 (ref 1.005–1.030)

## 2016-10-05 LAB — LIPASE, BLOOD: LIPASE: 20 U/L (ref 11–51)

## 2016-10-05 MED ORDER — ONDANSETRON 4 MG PO TBDP
ORAL_TABLET | ORAL | Status: AC
  Filled 2016-10-05: qty 1

## 2016-10-05 MED ORDER — SODIUM CHLORIDE 0.9 % IV BOLUS (SEPSIS)
1000.0000 mL | Freq: Once | INTRAVENOUS | Status: AC
  Administered 2016-10-05: 1000 mL via INTRAVENOUS

## 2016-10-05 MED ORDER — ONDANSETRON HCL 4 MG/2ML IJ SOLN
4.0000 mg | Freq: Once | INTRAMUSCULAR | Status: AC
  Administered 2016-10-05: 4 mg via INTRAVENOUS
  Filled 2016-10-05: qty 2

## 2016-10-05 MED ORDER — ONDANSETRON HCL 4 MG PO TABS: 4.0000 mg | ORAL_TABLET | Freq: Three times a day (TID) | ORAL | 0 refills | Status: DC | PRN

## 2016-10-05 MED ORDER — ONDANSETRON 4 MG PO TBDP
4.0000 mg | ORAL_TABLET | Freq: Once | ORAL | Status: AC | PRN
  Administered 2016-10-05: 4 mg via ORAL

## 2016-10-05 NOTE — ED Provider Notes (Signed)
[facility] Emergency Department Provider Note   ____________________________________________   I have reviewed the triage vital signs and the nursing notes.   HISTORY  Chief Complaint Emesis and Diarrhea   History limited by: Not Limited   HPI Mason Rodgers is a 65 y.o. male who presents to the emergency department today because of concerns for nausea, vomiting and diarrhea. The patient was discharged from the hospital 2 days ago after an admission for diverticulitis. He states that since going home and starting oral antibiotics he started to feel nauseous. It does seem to get worse shortly after he takes the ciprofloxacin. This morning he started having vomiting. In addition he started with diarrhea. Both nonbloody. He states tat he feels like his abdominal pain has improved since his admission. He has not had any recent fevers.   Past Medical History:  Diagnosis Date  . Anxiety   . Cancer ([place])   . Hypertension   . Kidney stone     Patient Active Problem List   Diagnosis Date Noted  . Diverticulitis 10/03/2016  . Acute diverticulitis 10/02/2016    Past Surgical History:  Procedure Laterality Date  . APPENDECTOMY    . CHOLECYSTECTOMY      Prior to Admission medications   Medication Sig Start Date End Date Taking? Authorizing Provider  ciprofloxacin (CIPRO) 500 MG tablet Take 1 tablet (500 mg total) by mouth 2 (two) times daily. 10/03/16   Fritzi Mandes, MD  ibuprofen (ADVIL,MOTRIN) 200 MG tablet Take 600 mg by mouth 2 (two) times daily.    Historical Provider, MD  metroNIDAZOLE (FLAGYL) 500 MG tablet Take 1 tablet (500 mg total) by mouth every 8 (eight) hours. 10/03/16   Fritzi Mandes, MD  olmesartan (BENICAR) 20 MG tablet Take 20 mg by mouth daily.    Historical Provider, MD  omeprazole (PRILOSEC) 20 MG capsule Take 20 mg by mouth daily.    Historical Provider, MD  PARoxetine (PAXIL) 30 MG tablet Take 30 mg by mouth daily.    Historical Provider,  MD    Allergies Penicillins  Family History  Problem Relation Age of Onset  . Hypertension Other     Social History Social History  Substance Use Topics  . Smoking status: Former Research scientist (life sciences)  . Smokeless tobacco: Never Used  . Alcohol use Yes    Review of Systems  Constitutional: Negative for fever. Cardiovascular: Negative for chest pain. Respiratory: Negative for shortness of breath. Gastrointestinal: Positive for nausea, vomiting and diarrhea. Improved abdominal pain. Neurological: Negative for headaches, focal weakness or numbness.  10-point ROS otherwise negative.  ____________________________________________   PHYSICAL EXAM:  VITAL SIGNS: ED Triage Vitals  Enc Vitals Group     BP 10/05/16 1234 (!) 141/87     Pulse Rate 10/05/16 1234 81     Resp 10/05/16 1234 18     Temp 10/05/16 1234 97.6 F (36.4 C)     Temp Source 10/05/16 1234 Oral     SpO2 10/05/16 1234 100 %     Weight 10/05/16 1235 171 lb (77.6 kg)     Height 10/05/16 1235 5\' 6"  (1.676 m)     Head Circumference --      Peak Flow --      Pain Score 10/05/16 1235 2   Constitutional: Alert and oriented. Well appearing and in no distress. Eyes: Conjunctivae are normal. Normal extraocular movements. ENT   Head: Normocephalic and atraumatic.   Nose: No congestion/rhinnorhea.   Mouth/Throat: Mucous membranes are moist.  Neck: No stridor. Hematological/Lymphatic/Immunilogical: No cervical lymphadenopathy. Cardiovascular: Normal rate, regular rhythm.  No murmurs, rubs, or gallops.  Respiratory: Normal respiratory effort without tachypnea nor retractions. Breath sounds are clear and equal bilaterally. No wheezes/rales/rhonchi. Gastrointestinal: Soft and tender to palpation in the suprapelvic region. Genitourinary: Deferred Musculoskeletal: Normal range of motion in all extremities. No lower extremity edema. Neurologic:  Normal speech and language. No gross focal neurologic deficits are  appreciated.  Skin:  Skin is warm, dry and intact. No rash noted. Psychiatric: Mood and affect are normal. Speech and behavior are normal. Patient exhibits appropriate insight and judgment.  ____________________________________________    LABS (pertinent positives/negatives)  Labs Reviewed  COMPREHENSIVE METABOLIC PANEL - Abnormal; Notable for the following:       Result Value   Glucose, Bld 142 (*)    Calcium 8.8 (*)    All other components within normal limits  CBC - Abnormal; Notable for the following:    WBC 11.3 (*)    All other components within normal limits  URINALYSIS COMPLETEWITH MICROSCOPIC ([facility] ONLY) - Abnormal; Notable for the following:    Color, Urine YELLOW (*)    APPearance CLEAR (*)    Glucose, UA 50 (*)    Ketones, ur TRACE (*)    Leukocytes, UA TRACE (*)    All other components within normal limits  LIPASE, BLOOD     ____________________________________________   EKG  None  ____________________________________________    RADIOLOGY  None  ____________________________________________   PROCEDURES  Procedures  ____________________________________________   INITIAL IMPRESSION / ASSESSMENT AND PLAN / ED COURSE  Pertinent labs & imaging results that were available during my care of the patient were reviewed by me and considered in my medical decision making (see chart for details).  Patient presents to the emergency department today with concerns for nausea vomiting and diarrhea. He was recently discharged for diverticulitis and has been on oral antibiotics. He thinks this is likely the cause of his symptoms. He certainly does have some abdominal tenderness on exam although he says this is been improving since his hospitalization. Patient without fever and with very mild leukocytosis here. At this point I think likely patient symptoms are related to the antibiotic use. Will plan on giving patient antiemetics. Patient has follow-up with his  primary care doctor tomorrow. ____________________________________________   FINAL CLINICAL IMPRESSION(S) / ED DIAGNOSES  Final diagnoses:  Nausea vomiting and diarrhea     Note: This dictation was prepared with Dragon dictation. Any transcriptional errors that result from this process are unintentional    Nance Pear, MD 10/05/16 1752

## 2016-10-05 NOTE — Discharge Instructions (Signed)
Please seek medical attention for any high fevers, chest pain, shortness of breath, change in behavior, persistent vomiting, bloody stool or any other new or concerning symptoms.  

## 2016-10-05 NOTE — ED Triage Notes (Signed)
Seen in ED on Thursday and diagnosed with Diverticulitis.  Given IVF and antibiotics (Flagyl and Cipro) and admitted overnight.  Discharged on Friday and has been taking oral antibiotics.  Began with diarrhea last night and diarrhea and vomiting today.  Symptoms appear to be worse after antibiotics.

## 2016-10-05 NOTE — ED Notes (Signed)
Pt.states N/V/D x2 days, denies home tx PTA, food and smells aggregate sx. Pt. States "not really sure" if able to tolerate fluids or food PO. Taking PO Cipro, Flagyl, at home currently for dx diverticulitis 2 days ago, prior to onset of sx.

## 2016-10-05 NOTE — ED Notes (Signed)

## 2016-11-10 ENCOUNTER — Other Ambulatory Visit: Payer: Self-pay | Admitting: Student

## 2016-11-10 DIAGNOSIS — R7989 Other specified abnormal findings of blood chemistry: Secondary | ICD-10-CM

## 2016-11-10 DIAGNOSIS — R945 Abnormal results of liver function studies: Principal | ICD-10-CM

## 2016-11-27 ENCOUNTER — Ambulatory Visit
Admission: RE | Admit: 2016-11-27 | Discharge: 2016-11-27 | Disposition: A | Discharge status: Home / Self Care | Payer: 59 | Source: Ambulatory Visit | Attending: Student | Admitting: Student

## 2016-11-27 DIAGNOSIS — D181 Lymphangioma, any site: Secondary | ICD-10-CM | POA: Insufficient documentation

## 2016-11-27 DIAGNOSIS — K449 Diaphragmatic hernia without obstruction or gangrene: Secondary | ICD-10-CM | POA: Insufficient documentation

## 2016-11-27 DIAGNOSIS — R7989 Other specified abnormal findings of blood chemistry: Secondary | ICD-10-CM | POA: Insufficient documentation

## 2016-11-27 DIAGNOSIS — R945 Abnormal results of liver function studies: Secondary | ICD-10-CM

## 2016-11-27 DIAGNOSIS — K654 Sclerosing mesenteritis: Secondary | ICD-10-CM | POA: Diagnosis not present

## 2016-11-27 DIAGNOSIS — Z9049 Acquired absence of other specified parts of digestive tract: Secondary | ICD-10-CM | POA: Insufficient documentation

## 2016-11-27 DIAGNOSIS — K7689 Other specified diseases of liver: Secondary | ICD-10-CM | POA: Diagnosis not present

## 2016-11-27 MED ORDER — GADOBENATE DIMEGLUMINE 529 MG/ML IV SOLN
15.0000 mL | Freq: Once | INTRAVENOUS | Status: AC | PRN
  Administered 2016-11-27: 15 mL via INTRAVENOUS

## 2016-12-25 ENCOUNTER — Ambulatory Visit: Payer: 59

## 2016-12-25 ENCOUNTER — Encounter: Payer: Self-pay | Admitting: Oncology

## 2016-12-25 ENCOUNTER — Encounter: Payer: Self-pay | Admitting: *Deleted

## 2016-12-25 ENCOUNTER — Inpatient Hospital Stay: Payer: PRIVATE HEALTH INSURANCE | Attending: Oncology | Admitting: Oncology

## 2016-12-25 DIAGNOSIS — Z79899 Other long term (current) drug therapy: Secondary | ICD-10-CM

## 2016-12-25 DIAGNOSIS — K449 Diaphragmatic hernia without obstruction or gangrene: Secondary | ICD-10-CM | POA: Insufficient documentation

## 2016-12-25 DIAGNOSIS — Z9049 Acquired absence of other specified parts of digestive tract: Secondary | ICD-10-CM | POA: Diagnosis not present

## 2016-12-25 DIAGNOSIS — K7689 Other specified diseases of liver: Secondary | ICD-10-CM | POA: Diagnosis not present

## 2016-12-25 DIAGNOSIS — Z87440 Personal history of urinary (tract) infections: Secondary | ICD-10-CM

## 2016-12-25 DIAGNOSIS — R5383 Other fatigue: Secondary | ICD-10-CM | POA: Diagnosis not present

## 2016-12-25 DIAGNOSIS — K5732 Diverticulitis of large intestine without perforation or abscess without bleeding: Secondary | ICD-10-CM | POA: Insufficient documentation

## 2016-12-25 DIAGNOSIS — F419 Anxiety disorder, unspecified: Secondary | ICD-10-CM | POA: Diagnosis not present

## 2016-12-25 DIAGNOSIS — Z87891 Personal history of nicotine dependence: Secondary | ICD-10-CM | POA: Insufficient documentation

## 2016-12-25 DIAGNOSIS — I10 Essential (primary) hypertension: Secondary | ICD-10-CM

## 2016-12-25 DIAGNOSIS — R531 Weakness: Secondary | ICD-10-CM | POA: Diagnosis not present

## 2016-12-25 DIAGNOSIS — D181 Lymphangioma, any site: Secondary | ICD-10-CM | POA: Insufficient documentation

## 2016-12-25 DIAGNOSIS — M2550 Pain in unspecified joint: Secondary | ICD-10-CM | POA: Diagnosis not present

## 2016-12-25 DIAGNOSIS — Z23 Encounter for immunization: Secondary | ICD-10-CM

## 2016-12-25 DIAGNOSIS — Z86010 Personal history of colonic polyps: Secondary | ICD-10-CM | POA: Insufficient documentation

## 2016-12-25 MED ORDER — INFLUENZA VAC SPLIT QUAD 0.5 ML IM SUSY
0.5000 mL | PREFILLED_SYRINGE | Freq: Once | INTRAMUSCULAR | Status: AC
  Administered 2016-12-25: 0.5 mL via INTRAMUSCULAR
  Filled 2016-12-25: qty 0.5

## 2016-12-25 NOTE — Progress Notes (Signed)
Pt new pt for hemochromatosis and here for consultation

## 2016-12-25 NOTE — Progress Notes (Signed)
Hematology/Oncology Consult note [facility] Telephone:([phone] Fax:([phone]  Patient Care Team: Madelyn Brunner, MD as PCP - General (Internal Medicine)   Name of the patient: Mason Rodgers  [id number]  1951-10-16    Reason for referral- history of hereditary hemochromatosis   Referring physician- Dr. Lisette Grinder  Date of visit: 12/25/16   History of presenting illness- patient is a 66 year old male who was admitted to the hospital in November 2017 with sigmoid diverticulitis area in he also has a personal history of colon polyps. [facility] in December 2017 and underwent a repeat colonoscopy. At that time he was found to have a normal AST but a mildly elevated ALT. Hepatitis A, B, and C testing was negative. His ferritin was elevated at 1150. And serum iron was elevated at 243. 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   Patient reports some fatigue and joint pain in his b/l knees and right ankle. He is a borderline diabetic and is not currently on any meds for the same. No known family h/o HH or liver/heart disorders. He has 2 sisters and 1 brothers who have not yet been etsted. He has an adopted son. Denies any cardiac complaints. Reports decreased interest in sexual functions over last efw years  ECOG PS- 0  Pain scale- 3- joint pain   Review of systems- Review of Systems  Constitutional: Positive for malaise/fatigue. Negative for chills, fever and weight loss.  HENT: Negative for congestion, ear discharge and nosebleeds.   Eyes: Negative for blurred vision.  Respiratory: Negative for cough, hemoptysis, sputum production, shortness of breath and wheezing.   Cardiovascular: Negative for chest pain, palpitations, orthopnea and claudication.  Gastrointestinal: Negative for abdominal pain, blood in stool, constipation, diarrhea, heartburn, melena, nausea and  vomiting.  Genitourinary: Negative for dysuria, flank pain, frequency, hematuria and urgency.       Decreased sexual desire  Musculoskeletal: Positive for joint pain. Negative for back pain and myalgias.  Skin: Negative for rash.  Neurological: Negative for dizziness, tingling, focal weakness, seizures, weakness and headaches.  Endo/Heme/Allergies: Does not bruise/bleed easily.  Psychiatric/Behavioral: Negative for depression and suicidal ideas. The patient does not have insomnia.     Allergies  Allergen Reactions  . Penicillin G Anaphylaxis  . Penicillins Anaphylaxis    Has patient had a PCN reaction causing immediate rash, facial/tongue/throat swelling, SOB or lightheadedness with hypotension: Yes Has patient had a PCN reaction causing severe rash involving mucus membranes or skin necrosis: No Has patient had a PCN reaction that required hospitalization Yes Has patient had a PCN reaction occurring within the last 10 years: No If all of the above answers are "NO", then may proceed with Cephalosporin use.     Patient Active Problem List   Diagnosis Date Noted  . Hereditary hemochromatosis ([place]) 12/25/2016  . Diverticulitis 10/03/2016  . Acute diverticulitis 10/02/2016     Past Medical History:  Diagnosis Date  . Anxiety   . Cancer ([place])   . Hypertension   . Kidney stone      Past Surgical History:  Procedure Laterality Date  . APPENDECTOMY    . arthoscopy Bilateral   . CHOLECYSTECTOMY    . NASAL FRACTURE SURGERY    . TONSILLECTOMY      Social History   Social History  . Marital status: Married    Spouse name: N/A  . Number of children: N/A  .  Years of education: N/A   Occupational History  . Not on file.   Social History Main Topics  . Smoking status: Former Smoker    Packs/day: 0.25    Years: 30.00    Quit date: 11/24/1994  . Smokeless tobacco: Never Used  . Alcohol use Yes     Comment: occasional wine,beer  . Drug use: No  . Sexual activity: Not on  file   Other Topics Concern  . Not on file   Social History Narrative  . No narrative on file     Family History  Problem Relation Age of Onset  . Hypertension Other      Current Outpatient Prescriptions:  .  ibuprofen (ADVIL,MOTRIN) 200 MG tablet, Take 400 mg by mouth every 6 (six) hours as needed., Disp: , Rfl:  .  loratadine (CLARITIN) 10 MG tablet, Take 10 mg by mouth daily as needed., Disp: , Rfl:  .  olmesartan (BENICAR) 20 MG tablet, Take 20 mg by mouth daily., Disp: , Rfl:  .  omeprazole (PRILOSEC) 20 MG capsule, Take 20 mg by mouth daily., Disp: , Rfl:  .  PARoxetine (PAXIL) 30 MG tablet, Take 30 mg by mouth daily., Disp: , Rfl:    Physical exam:  Vitals:   12/25/16 1117  BP: 126/81  Pulse: 86  Resp: 18  Temp: 98.1 F (36.7 C)  TempSrc: Tympanic  SpO2: 98%  Weight: 165 lb 9.1 oz (75.1 kg)  Height: 5\' 6"  (1.676 m)   Physical Exam  Constitutional: He is oriented to person, place, and time and well-developed, well-nourished, and in no distress.  HENT:  Head: Normocephalic and atraumatic.  Eyes: EOM are normal. Pupils are equal, round, and reactive to light.  Neck: Normal range of motion.  Cardiovascular: Normal rate, regular rhythm and normal heart sounds.   Pulmonary/Chest: Effort normal and breath sounds normal.  Abdominal: Soft. Bowel sounds are normal.  Musculoskeletal:  No joint swelling  Neurological: He is alert and oriented to person, place, and time.  Skin: Skin is warm and dry.       CMP Latest Ref Rng & Units 10/05/2016  Glucose 65 - 99 mg/dL 142(H)  BUN 6 - 20 mg/dL 14  Creatinine 0.61 - 1.24 mg/dL 1.01  Sodium 135 - 145 mmol/L 135  Potassium 3.5 - 5.1 mmol/L 3.8  Chloride 101 - 111 mmol/L 104  CO2 22 - 32 mmol/L 23  Calcium 8.9 - 10.3 mg/dL 8.8(L)  Total Protein 6.5 - 8.1 g/dL 7.4  Total Bilirubin 0.3 - 1.2 mg/dL 1.1  Alkaline Phos 38 - 126 U/L 52  AST 15 - 41 U/L 25  ALT 17 - 63 U/L 39   CBC Latest Ref Rng & Units 10/05/2016    WBC 3.8 - 10.6 K/uL 11.3(H)  Hemoglobin 13.0 - 18.0 g/dL 14.8  Hematocrit 40.0 - 52.0 % 43.7  Platelets 150 - 440 K/uL 249    No images are attached to the encounter.  Mr Liver W Wo Contrast  Result Date: 11/27/2016 CLINICAL DATA:  66 year old male with elevated liver function tests/elevated ferritin. Prior cholecystectomy and appendectomy. EXAM: MRI ABDOMEN WITHOUT AND WITH CONTRAST TECHNIQUE: Multiplanar multisequence MR imaging of the abdomen was performed both before and after the administration of intravenous contrast. CONTRAST:  50mL MULTIHANCE GADOBENATE DIMEGLUMINE 529 MG/ML IV SOLN COMPARISON:  10/02/2016 CT abdomen/pelvis . FINDINGS: Lower chest: Clear lung bases. Hepatobiliary: Normal liver size and configuration. The liver parenchyma appears diffusely uniformly low in attenuation on T2 and  inphase T1 sequences. There is mild signal loss in the liver parenchyma on the out of phase chemical shift sequence compared to the inphase chemical shift sequence. These findings suggest a combination of hepatic iron deposition and hepatic steatosis. There are a few scattered sub 5 mm simple appearing liver cysts. No suspicious liver masses. Cholecystectomy. Bile ducts are within expected post cholecystectomy limits. Minimal central intrahepatic biliary ectasia. Common bile duct diameter 6 mm. No choledocholithiasis. No biliary strictures. Pancreas: No pancreatic mass or duct dilation. No pancreas divisum. Diffuse fatty infiltration of the pancreas. No evidence of iron deposition in the pancreas. Spleen: Normal size spleen. There is a 1.1 cm cystic lesion in the anterior spleen with the suggestion of a few thin internal septations without wall thickening or solid enhancement, consistent with a lymphangioma. No additional splenic lesions. No evidence of iron deposition in the spleen. Adrenals/Urinary Tract: Normal adrenals. No hydronephrosis. Normal kidneys with no renal mass. Stomach/Bowel: Moderate hiatal  hernia. Otherwise collapsed and grossly normal stomach. Visualized small and large bowel is normal caliber, with no bowel wall thickening. Vascular/Lymphatic: Normal caliber abdominal aorta. Patent portal, splenic, hepatic and renal veins. No pathologically enlarged lymph nodes in the abdomen. Other: No abdominal ascites or focal fluid collection. Nonspecific hazy edema in the central mesentery, unchanged back to the 12/17/2011 and not associated with mass, adenopathy or fluid collection, consistent with stable mesenteric panniculitis. Musculoskeletal: No aggressive appearing focal osseous lesions. No evidence of significant iron deposition in the marrow. IMPRESSION: 1. No macroscopic evidence of cirrhosis. Tiny simple liver cysts. No suspicious liver masses. 2. MRI findings suggest a combination of hepatic iron deposition and hepatic steatosis. No evidence of significant iron deposition in the spleen, pancreas or marrow. 3. Cholecystectomy. Bile ducts are within normal post cholecystectomy limits. 4. Small splenic lymphangioma.  Normal size spleen. 5. Moderate hiatal hernia. 6. Stable chronic mesenteric panniculitis. Electronically Signed   By: Ilona Sorrel M.D.   On: 11/27/2016 09:52    Assessment and plan- Patient is a 66 y.o. male with newly diagnosed hereditary hemachromatosis homozygous for C 282Y  1. I explained to the patient is natural history of hemochromatosis as well as genetic inheritance. Given that he is  Homozygous for the same; I would recommend that his siblings should be tested for hemochromatosis as well. I have given him written information about hemochromatosis. I will be checking CBC, CMP, TSH, ferritin and iron studies from [employer]. We will plan to initiate weekly phelbotomy starting today with a goal to keep ferritin < 50 and transferrin saturation <50% given that ferritin was >1000 in dec 2017.  Recommended that he can continue to eat normal healthy diet but avoid taking any iron or  vitamin C  supplements. Abstain from alcohol and avoid consumption of uncooked sea food such as oysters. He will need screening for hepatocellular carcinoma and he will f/u with GI about this.  I will see him in 2 months with repeat cbc, cmp and iron studies and patient will continue to undergo weekly phlebotomies in the interim.    Total face to face encounter time for this patient visit was 45 min. >50% of the time was  spent in counseling and coordination of care.     Thank you for this kind referral and the opportunity to participate in the care of this patient   Visit Diagnosis 1. Hereditary hemochromatosis ([place])     Dr. Randa Evens, MD, MPH [facility] at [facility] Pager- [phone] 12/25/2016  12:30 PM

## 2016-12-26 ENCOUNTER — Encounter: Payer: Self-pay | Admitting: Oncology

## 2017-01-01 ENCOUNTER — Encounter: Payer: Self-pay | Admitting: Oncology

## 2017-01-01 ENCOUNTER — Inpatient Hospital Stay: Payer: PRIVATE HEALTH INSURANCE

## 2017-01-01 ENCOUNTER — Other Ambulatory Visit: Payer: Self-pay | Admitting: Oncology

## 2017-01-01 MED ORDER — SODIUM CHLORIDE 0.9 % IV SOLN
Freq: Once | INTRAVENOUS | Status: AC
  Administered 2017-01-01: 14:00:00 via INTRAVENOUS
  Filled 2017-01-01: qty 1000

## 2017-01-08 ENCOUNTER — Inpatient Hospital Stay: Payer: PRIVATE HEALTH INSURANCE

## 2017-01-08 MED ORDER — SODIUM CHLORIDE 0.9 % IV SOLN
Freq: Once | INTRAVENOUS | Status: AC
  Administered 2017-01-08: 14:00:00 via INTRAVENOUS
  Filled 2017-01-08: qty 1000

## 2017-01-14 ENCOUNTER — Encounter: Payer: Self-pay | Admitting: Oncology

## 2017-01-15 ENCOUNTER — Inpatient Hospital Stay: Payer: PRIVATE HEALTH INSURANCE

## 2017-01-15 MED ORDER — SODIUM CHLORIDE 0.9 % IV SOLN
Freq: Once | INTRAVENOUS | Status: AC
  Administered 2017-01-15: 14:00:00 via INTRAVENOUS
  Filled 2017-01-15: qty 1000

## 2017-01-22 ENCOUNTER — Inpatient Hospital Stay: Payer: 59 | Attending: Oncology

## 2017-01-22 DIAGNOSIS — I10 Essential (primary) hypertension: Secondary | ICD-10-CM | POA: Insufficient documentation

## 2017-01-22 DIAGNOSIS — Z85828 Personal history of other malignant neoplasm of skin: Secondary | ICD-10-CM | POA: Insufficient documentation

## 2017-01-22 DIAGNOSIS — M2550 Pain in unspecified joint: Secondary | ICD-10-CM | POA: Insufficient documentation

## 2017-01-22 DIAGNOSIS — Z86010 Personal history of colonic polyps: Secondary | ICD-10-CM | POA: Diagnosis not present

## 2017-01-22 DIAGNOSIS — Z801 Family history of malignant neoplasm of trachea, bronchus and lung: Secondary | ICD-10-CM | POA: Diagnosis not present

## 2017-01-22 DIAGNOSIS — R5383 Other fatigue: Secondary | ICD-10-CM | POA: Insufficient documentation

## 2017-01-22 DIAGNOSIS — Z87891 Personal history of nicotine dependence: Secondary | ICD-10-CM | POA: Diagnosis not present

## 2017-01-22 DIAGNOSIS — Z87442 Personal history of urinary calculi: Secondary | ICD-10-CM | POA: Insufficient documentation

## 2017-01-22 DIAGNOSIS — Z79899 Other long term (current) drug therapy: Secondary | ICD-10-CM | POA: Insufficient documentation

## 2017-01-22 DIAGNOSIS — Z8052 Family history of malignant neoplasm of bladder: Secondary | ICD-10-CM | POA: Diagnosis not present

## 2017-01-22 DIAGNOSIS — F419 Anxiety disorder, unspecified: Secondary | ICD-10-CM | POA: Insufficient documentation

## 2017-01-22 DIAGNOSIS — Z8041 Family history of malignant neoplasm of ovary: Secondary | ICD-10-CM | POA: Insufficient documentation

## 2017-01-22 MED ORDER — SODIUM CHLORIDE 0.9 % IV SOLN
Freq: Once | INTRAVENOUS | Status: AC
  Administered 2017-01-22: 14:00:00 via INTRAVENOUS
  Filled 2017-01-22: qty 1000

## 2017-01-28 ENCOUNTER — Encounter: Payer: Self-pay | Admitting: Oncology

## 2017-01-29 ENCOUNTER — Inpatient Hospital Stay: Payer: 59

## 2017-02-05 ENCOUNTER — Inpatient Hospital Stay: Payer: 59

## 2017-02-05 MED ORDER — SODIUM CHLORIDE 0.9 % IV SOLN
Freq: Once | INTRAVENOUS | Status: AC
  Administered 2017-02-05: 14:00:00 via INTRAVENOUS
  Filled 2017-02-05: qty 1000

## 2017-02-12 ENCOUNTER — Ambulatory Visit: Payer: 59 | Admitting: Oncology

## 2017-02-17 ENCOUNTER — Inpatient Hospital Stay (HOSPITAL_BASED_OUTPATIENT_CLINIC_OR_DEPARTMENT_OTHER): Payer: 59 | Admitting: Oncology

## 2017-02-17 ENCOUNTER — Inpatient Hospital Stay: Payer: 59

## 2017-02-17 ENCOUNTER — Encounter: Payer: Self-pay | Admitting: Oncology

## 2017-02-17 DIAGNOSIS — Z801 Family history of malignant neoplasm of trachea, bronchus and lung: Secondary | ICD-10-CM

## 2017-02-17 DIAGNOSIS — I10 Essential (primary) hypertension: Secondary | ICD-10-CM

## 2017-02-17 DIAGNOSIS — Z86010 Personal history of colonic polyps: Secondary | ICD-10-CM | POA: Diagnosis not present

## 2017-02-17 DIAGNOSIS — Z85828 Personal history of other malignant neoplasm of skin: Secondary | ICD-10-CM | POA: Diagnosis not present

## 2017-02-17 DIAGNOSIS — Z87442 Personal history of urinary calculi: Secondary | ICD-10-CM

## 2017-02-17 DIAGNOSIS — Z8041 Family history of malignant neoplasm of ovary: Secondary | ICD-10-CM

## 2017-02-17 DIAGNOSIS — R5383 Other fatigue: Secondary | ICD-10-CM

## 2017-02-17 DIAGNOSIS — Z8052 Family history of malignant neoplasm of bladder: Secondary | ICD-10-CM

## 2017-02-17 DIAGNOSIS — Z79899 Other long term (current) drug therapy: Secondary | ICD-10-CM | POA: Diagnosis not present

## 2017-02-17 DIAGNOSIS — F419 Anxiety disorder, unspecified: Secondary | ICD-10-CM

## 2017-02-17 DIAGNOSIS — Z87891 Personal history of nicotine dependence: Secondary | ICD-10-CM | POA: Diagnosis not present

## 2017-02-17 DIAGNOSIS — M2550 Pain in unspecified joint: Secondary | ICD-10-CM | POA: Diagnosis not present

## 2017-02-17 MED ORDER — SODIUM CHLORIDE 0.9 % IV SOLN
Freq: Once | INTRAVENOUS | Status: AC
  Administered 2017-02-17: 15:00:00 via INTRAVENOUS
  Filled 2017-02-17: qty 1000

## 2017-02-17 NOTE — Progress Notes (Signed)
Patient here today for follow up.  Patient states no new concerns today  

## 2017-02-17 NOTE — Progress Notes (Signed)
Hematology/Oncology Consult note [facility]  Telephone:([phone] Fax:([phone]  Patient Care Team: Madelyn Brunner, MD as PCP - General (Internal Medicine)   Name of the patient: Mason Rodgers  [id number]  [birth year]   Date of visit: 02/17/17  Diagnosis- hereditary hemochromatosis:Homozygosity for C282Y mutation  Chief complaint/ Reason for visit- routine follow-up  Heme/Onc history:  patient is a 66 year old male who was admitted to the hospital in November 2017 with sigmoid diverticulitis area in he also has a personal history of colon polyps. He was [facility] in December 2017 and underwent a repeat colonoscopy. At that time he was found to have a normal AST but a mildly elevated ALT. Hepatitis A, B, and C testing was negative. His ferritin was elevated at 1150. And serum iron was elevated at 243. 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   Patient reports some fatigue and joint pain in his b/l knees and right ankle. He is a borderline diabetic and is not currently on any meds for the same. No known family h/o HH or liver/heart disorders. He has 2 sisters and 1 brothers who have not yet been tested. He has an adopted son. Denies any cardiac complaints. Reports decreased interest in sexual functions over last few years  Patient was started on weekly phlebotomies starting to 12/13/2016.   Interval history- reports that his energy levels are better and joint pain is less. He has had to hold his blood pressure medication the night prior to his phlebotomy as he sometimes feels lightheaded after the procedure despite receiving IV fluids  ECOG PS- 0 Pain scale- 0  Review of systems- Review of Systems  Constitutional: Negative for chills, fever, malaise/fatigue and weight loss.  HENT: Negative for congestion, ear discharge and nosebleeds.   Eyes: Negative for blurred vision.   Respiratory: Negative for cough, hemoptysis, sputum production, shortness of breath and wheezing.   Cardiovascular: Negative for chest pain, palpitations, orthopnea and claudication.  Gastrointestinal: Negative for abdominal pain, blood in stool, constipation, diarrhea, heartburn, melena, nausea and vomiting.  Genitourinary: Negative for dysuria, flank pain, frequency, hematuria and urgency.  Musculoskeletal: Negative for back pain, joint pain and myalgias.  Skin: Negative for rash.  Neurological: Negative for dizziness, tingling, focal weakness, seizures, weakness and headaches.  Endo/Heme/Allergies: Does not bruise/bleed easily.  Psychiatric/Behavioral: Negative for depression and suicidal ideas. The patient does not have insomnia.      Current treatment- weekly phlebotomy  Allergies  Allergen Reactions  . Penicillin G Anaphylaxis  . Penicillins Anaphylaxis    Has patient had a PCN reaction causing immediate rash, facial/tongue/throat swelling, SOB or lightheadedness with hypotension: Yes Has patient had a PCN reaction causing severe rash involving mucus membranes or skin necrosis: No Has patient had a PCN reaction that required hospitalization Yes Has patient had a PCN reaction occurring within the last 10 years: No If all of the above answers are "NO", then may proceed with Cephalosporin use.      Past Medical History:  Diagnosis Date  . Anxiety   . Hypertension   . Kidney stone   . Skin cancer    squamous cell on head, chest and arms     Past Surgical History:  Procedure Laterality Date  . APPENDECTOMY    . arthoscopy Bilateral   . CHOLECYSTECTOMY    . NASAL FRACTURE SURGERY    . TONSILLECTOMY  Social History   Social History  . Marital status: Married    Spouse name: N/A  . Number of children: N/A  . Years of education: N/A   Occupational History  . Not on file.   Social History Main Topics  . Smoking status: Former Smoker    Packs/day: 0.25     Years: 30.00    Quit date: 11/24/1994  . Smokeless tobacco: Never Used  . Alcohol use Yes     Comment: occasional wine,beer  . Drug use: No  . Sexual activity: Not on file   Other Topics Concern  . Not on file   Social History Narrative  . No narrative on file    Family History  Problem Relation Age of Onset  . Lung cancer Father   . Ovarian cancer Sister   . Diabetes Sister   . Kidney cancer Maternal Grandmother   . Diabetes Maternal Grandmother   . Heart disease Maternal Grandmother      Current Outpatient Prescriptions:  .  ibuprofen (ADVIL,MOTRIN) 200 MG tablet, Take 400 mg by mouth every 6 (six) hours as needed., Disp: , Rfl:  .  loratadine (CLARITIN) 10 MG tablet, Take 10 mg by mouth daily as needed., Disp: , Rfl:  .  olmesartan (BENICAR) 20 MG tablet, Take 20 mg by mouth daily., Disp: , Rfl:  .  omeprazole (PRILOSEC) 20 MG capsule, Take 20 mg by mouth daily., Disp: , Rfl:  .  PARoxetine (PAXIL) 30 MG tablet, Take 30 mg by mouth daily., Disp: , Rfl:   Physical exam:  Vitals:   02/17/17 1334  BP: 100/62  Pulse: 73  Resp: 16  Temp: (!) 96 F (35.6 C)  TempSrc: Tympanic  Weight: 163 lb 8 oz (74.2 kg)   Physical Exam  Constitutional: He is oriented to person, place, and time and well-developed, well-nourished, and in no distress.  HENT:  Head: Normocephalic and atraumatic.  Eyes: EOM are normal. Pupils are equal, round, and reactive to light.  Neck: Normal range of motion.  Cardiovascular: Normal rate, regular rhythm and normal heart sounds.   Pulmonary/Chest: Effort normal and breath sounds normal.  Abdominal: Soft. Bowel sounds are normal.  Neurological: He is alert and oriented to person, place, and time.  Skin: Skin is warm and dry.     CMP Latest Ref Rng & Units 10/05/2016  Glucose 65 - 99 mg/dL 142(H)  BUN 6 - 20 mg/dL 14  Creatinine 0.61 - 1.24 mg/dL 1.01  Sodium 135 - 145 mmol/L 135  Potassium 3.5 - 5.1 mmol/L 3.8  Chloride 101 - 111 mmol/L 104    CO2 22 - 32 mmol/L 23  Calcium 8.9 - 10.3 mg/dL 8.8(L)  Total Protein 6.5 - 8.1 g/dL 7.4  Total Bilirubin 0.3 - 1.2 mg/dL 1.1  Alkaline Phos 38 - 126 U/L 52  AST 15 - 41 U/L 25  ALT 17 - 63 U/L 39   CBC Latest Ref Rng & Units 10/05/2016  WBC 3.8 - 10.6 K/uL 11.3(H)  Hemoglobin 13.0 - 18.0 g/dL 14.8  Hematocrit 40.0 - 52.0 % 43.7  Platelets 150 - 440 K/uL 249   CBC from 02/12/2017 showed white count of 5.2, H&H of 12.8/37.3 with an MCV of 91 and a platelet count of 330. BMP was within normal limits. Ferritin was 122. TSH was normal at 2.3 in February 2018  MRI of the abdomen from January 2018 didn't show iron deposits in the liver and hepatic steatosis but no evidence of  any suspicious liver masses of liver cirrhosis. Patient is scheduled for her ultrasound abdomen with fibrosis can in July 2018 with [facility]   Assessment and plan- Patient is a 66 y.o. male with a history of hereditary hemochromatosis with homozygosity for C282Y mutation  Overall patient is tolerating his phlebotomy well and his most recent ferritin from 02/12/2017 was 122. We will continue Q2 weekly phlebotomy at this time until his ferritin is less than 50. Once we achieve that goal we will plan to perform phlebotomy every 2-4 months to keep her ferritin between 50-100. I will see him back in 10-12 weeks time with a repeat CBC and ferritin prior and possible phlebotomy on that day   Visit Diagnosis 1. Hereditary hemochromatosis ([place])   2. Iron overload      Dr. Randa Evens, MD, MPH [place] at [facility] Pager- [phone] 02/17/2017 1:22 PM

## 2017-02-25 ENCOUNTER — Encounter: Payer: Self-pay | Admitting: Oncology

## 2017-03-02 ENCOUNTER — Encounter: Payer: Self-pay | Admitting: Oncology

## 2017-03-03 ENCOUNTER — Inpatient Hospital Stay: Payer: 59 | Attending: Oncology

## 2017-03-03 ENCOUNTER — Inpatient Hospital Stay: Payer: PRIVATE HEALTH INSURANCE

## 2017-03-03 MED ORDER — SODIUM CHLORIDE 0.9 % IV SOLN
Freq: Once | INTRAVENOUS | Status: AC
  Administered 2017-03-03: 14:00:00 via INTRAVENOUS
  Filled 2017-03-03: qty 1000

## 2017-03-06 ENCOUNTER — Encounter: Payer: Self-pay | Admitting: Oncology

## 2017-03-16 ENCOUNTER — Encounter: Payer: Self-pay | Admitting: Oncology

## 2017-03-17 ENCOUNTER — Inpatient Hospital Stay: Payer: 59

## 2017-03-17 ENCOUNTER — Telehealth: Payer: Self-pay | Admitting: *Deleted

## 2017-03-17 NOTE — Telephone Encounter (Signed)
I called for results of labs from [facility] and showed them to Dr Janese Banks. Patient informed that he does not need a phlebotomy today and all currently scheduled appts are being cancelled. He is to return in 3 months for MD/ possible Phlebotmy  Will need [facility] form mailed to him for 3 months per VO Dr Janese Banks. Appts cancelled and message sent to schedluers

## 2017-05-11 ENCOUNTER — Other Ambulatory Visit: Payer: Self-pay | Admitting: Student

## 2017-05-11 DIAGNOSIS — K760 Fatty (change of) liver, not elsewhere classified: Secondary | ICD-10-CM

## 2017-05-15 ENCOUNTER — Ambulatory Visit
Admission: RE | Admit: 2017-05-15 | Discharge: 2017-05-15 | Disposition: A | Discharge status: Home / Self Care | Payer: 59 | Source: Ambulatory Visit | Attending: Student | Admitting: Student

## 2017-05-15 DIAGNOSIS — K760 Fatty (change of) liver, not elsewhere classified: Secondary | ICD-10-CM

## 2017-06-16 ENCOUNTER — Inpatient Hospital Stay: Payer: 59 | Attending: Oncology | Admitting: Oncology

## 2017-06-16 ENCOUNTER — Inpatient Hospital Stay: Payer: 59

## 2017-06-16 ENCOUNTER — Encounter: Payer: Self-pay | Admitting: Oncology

## 2017-06-16 DIAGNOSIS — Z880 Allergy status to penicillin: Secondary | ICD-10-CM | POA: Insufficient documentation

## 2017-06-16 DIAGNOSIS — Z8041 Family history of malignant neoplasm of ovary: Secondary | ICD-10-CM | POA: Diagnosis not present

## 2017-06-16 DIAGNOSIS — R5383 Other fatigue: Secondary | ICD-10-CM

## 2017-06-16 DIAGNOSIS — Z79899 Other long term (current) drug therapy: Secondary | ICD-10-CM | POA: Diagnosis not present

## 2017-06-16 DIAGNOSIS — Z8051 Family history of malignant neoplasm of kidney: Secondary | ICD-10-CM | POA: Insufficient documentation

## 2017-06-16 DIAGNOSIS — Z87891 Personal history of nicotine dependence: Secondary | ICD-10-CM | POA: Diagnosis not present

## 2017-06-16 DIAGNOSIS — M25562 Pain in left knee: Secondary | ICD-10-CM | POA: Diagnosis not present

## 2017-06-16 DIAGNOSIS — Z87442 Personal history of urinary calculi: Secondary | ICD-10-CM | POA: Diagnosis not present

## 2017-06-16 DIAGNOSIS — M25561 Pain in right knee: Secondary | ICD-10-CM | POA: Diagnosis not present

## 2017-06-16 DIAGNOSIS — Z85828 Personal history of other malignant neoplasm of skin: Secondary | ICD-10-CM

## 2017-06-16 DIAGNOSIS — K5730 Diverticulosis of large intestine without perforation or abscess without bleeding: Secondary | ICD-10-CM | POA: Diagnosis not present

## 2017-06-16 DIAGNOSIS — Z801 Family history of malignant neoplasm of trachea, bronchus and lung: Secondary | ICD-10-CM | POA: Diagnosis not present

## 2017-06-16 DIAGNOSIS — Z86010 Personal history of colonic polyps: Secondary | ICD-10-CM | POA: Diagnosis not present

## 2017-06-16 DIAGNOSIS — I10 Essential (primary) hypertension: Secondary | ICD-10-CM | POA: Insufficient documentation

## 2017-06-16 DIAGNOSIS — F419 Anxiety disorder, unspecified: Secondary | ICD-10-CM | POA: Insufficient documentation

## 2017-06-16 NOTE — Progress Notes (Signed)
Patient here for follow up. No changes since last appointment.  

## 2017-06-18 NOTE — Progress Notes (Signed)
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 [facility] in December 2017 and underwent a repeat colonoscopy. At that time he was found to have a normal AST but a mildly elevated ALT. Hepatitis A, B, and C testing was negative. His ferritin was elevated at 1150. And serum iron was elevated at 243. 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   Patient reports some fatigue and joint pain in his b/l knees and right ankle. He is a borderline diabetic and is not currently on any meds for the same. No known family h/o HH or liver/heart disorders. He has 2 sisters and 1 brothers who have not yet been tested. He has an adopted son. Denies any cardiac complaints. Reports decreased interest in sexual functions over last few years  Patient was started on weekly phlebotomies starting to 12/13/2016. Phlebotomy was stopped after ferritin was <50. Currently he is in maintenance phase with a goal to keep ferritin between 50-100  Interval history- still feels fatigued. Denies other complaints  ECOG PS- 0 Pain scale- 0   Review of systems- Review of Systems  Constitutional: Negative for chills, fever, malaise/fatigue and weight loss.  HENT: Negative for congestion, ear discharge and nosebleeds.   Eyes: Negative for blurred vision.  Respiratory: Negative for cough, hemoptysis, sputum  production, shortness of breath and wheezing.   Cardiovascular: Negative for chest pain, palpitations, orthopnea and claudication.  Gastrointestinal: Negative for abdominal pain, blood in stool, constipation, diarrhea, heartburn, melena, nausea and vomiting.  Genitourinary: Negative for dysuria, flank pain, frequency, hematuria and urgency.  Musculoskeletal: Negative for back pain, joint pain and myalgias.  Skin: Negative for rash.  Neurological: Negative for dizziness, tingling, focal weakness, seizures, weakness and headaches.  Endo/Heme/Allergies: Does not bruise/bleed easily.  Psychiatric/Behavioral: Negative for depression and suicidal ideas. The patient does not have insomnia.        Allergies  Allergen Reactions  . Penicillin G Anaphylaxis  . Penicillins Anaphylaxis    Has patient had a PCN reaction causing immediate rash, facial/tongue/throat swelling, SOB or lightheadedness with hypotension: Yes Has patient had a PCN reaction causing severe rash involving mucus membranes or skin necrosis: No Has patient had a PCN reaction that required hospitalization Yes Has patient had a PCN reaction occurring within the last 10 years: No If all of the above answers are "NO", then may proceed with Cephalosporin use.      Past Medical History:  Diagnosis Date  . Anxiety   . Hypertension   . Kidney stone   . Skin cancer    squamous cell on head, chest and arms     Past Surgical History:  Procedure Laterality Date  . APPENDECTOMY    . arthoscopy Bilateral   . CHOLECYSTECTOMY    . NASAL FRACTURE SURGERY    . TONSILLECTOMY      Social History   Social History  . Marital status: Married    Spouse name:  N/A  . Number of children: N/A  . Years of education: N/A   Occupational History  . Not on file.   Social History Main Topics  . Smoking status: Former Smoker    Packs/day: 0.25    Years: 30.00    Quit date: 11/24/1994  . Smokeless tobacco: Never Used  . Alcohol use Yes       Comment: occasional wine,beer  . Drug use: No  . Sexual activity: Not on file   Other Topics Concern  . Not on file   Social History Narrative  . No narrative on file    Family History  Problem Relation Age of Onset  . Lung cancer Father   . Ovarian cancer Sister   . Diabetes Sister   . Kidney cancer Maternal Grandmother   . Diabetes Maternal Grandmother   . Heart disease Maternal Grandmother      Current Outpatient Prescriptions:  .  ibuprofen (ADVIL,MOTRIN) 200 MG tablet, Take 400 mg by mouth every 6 (six) hours as needed., Disp: , Rfl:  .  loratadine (CLARITIN) 10 MG tablet, Take 10 mg by mouth daily as needed., Disp: , Rfl:  .  olmesartan (BENICAR) 20 MG tablet, Take 20 mg by mouth daily., Disp: , Rfl:  .  omeprazole (PRILOSEC) 20 MG capsule, Take 20 mg by mouth daily., Disp: , Rfl:  .  sertraline (ZOLOFT) 50 MG tablet, TAKE 1 TABLET (50 MG TOTAL) BY MOUTH ONCE DAILY., Disp: , Rfl: 11  Physical exam:  Vitals:   06/16/17 1341  BP: 135/79  Pulse: 77  Temp: 97.9 F (36.6 C)  TempSrc: Tympanic  Weight: 160 lb 6.4 oz (72.8 kg)  Height: 5\' 6"  (1.676 m)   Physical Exam  Constitutional: He is oriented to person, place, and time and well-developed, well-nourished, and in no distress.  HENT:  Head: Normocephalic and atraumatic.  Eyes: Pupils are equal, round, and reactive to light. EOM are normal.  Neck: Normal range of motion.  Cardiovascular: Normal rate, regular rhythm and normal heart sounds.   Pulmonary/Chest: Effort normal and breath sounds normal.  Abdominal: Soft. Bowel sounds are normal.  Neurological: He is alert and oriented to person, place, and time.  Skin: Skin is warm and dry.     CMP Latest Ref Rng & Units 10/05/2016  Glucose 65 - 99 mg/dL 142(H)  BUN 6 - 20 mg/dL 14  Creatinine 0.61 - 1.24 mg/dL 1.01  Sodium 135 - 145 mmol/L 135  Potassium 3.5 - 5.1 mmol/L 3.8  Chloride 101 - 111 mmol/L 104  CO2 22 - 32 mmol/L 23  Calcium 8.9 - 10.3 mg/dL  8.8(L)  Total Protein 6.5 - 8.1 g/dL 7.4  Total Bilirubin 0.3 - 1.2 mg/dL 1.1  Alkaline Phos 38 - 126 U/L 52  AST 15 - 41 U/L 25  ALT 17 - 63 U/L 39   CBC Latest Ref Rng & Units 10/05/2016  WBC 3.8 - 10.6 K/uL 11.3(H)  Hemoglobin 13.0 - 18.0 g/dL 14.8  Hematocrit 40.0 - 52.0 % 43.7  Platelets 150 - 440 K/uL 249     Assessment and plan- Patient is a 66 y.o. male with a history of hereditary hemochromatosis with homozygosity for C282Y mutation   Ferritin done recently was 76. Goal ferritin is between 50-100. LFT's normal. No need for phlebotomy at this time. [facility] surveillance. Recent USH from June 2018 showed fatty liver. No cirrhosis or evidence of malignancy  He  is undergoing sleep study soon to evaluate for possible OSA if it is contributing to his fatigue  rtc in 3 months with cbc, cmp, ferritin and iron studies   Visit Diagnosis 1. Hereditary hemochromatosis ([place])      Dr. Randa Evens, MD, MPH [facility] at [facility] Pager- [phone] 06/18/2017 8:28 AM

## 2017-09-17 ENCOUNTER — Inpatient Hospital Stay: Payer: 59 | Attending: Oncology | Admitting: Oncology

## 2017-09-17 ENCOUNTER — Inpatient Hospital Stay: Payer: 59

## 2017-09-17 ENCOUNTER — Encounter: Payer: Self-pay | Admitting: Oncology

## 2017-09-17 ENCOUNTER — Other Ambulatory Visit: Payer: Self-pay | Admitting: *Deleted

## 2017-09-17 DIAGNOSIS — Z8051 Family history of malignant neoplasm of kidney: Secondary | ICD-10-CM | POA: Insufficient documentation

## 2017-09-17 DIAGNOSIS — Z8041 Family history of malignant neoplasm of ovary: Secondary | ICD-10-CM | POA: Diagnosis not present

## 2017-09-17 DIAGNOSIS — Z85828 Personal history of other malignant neoplasm of skin: Secondary | ICD-10-CM | POA: Diagnosis not present

## 2017-09-17 DIAGNOSIS — Z9049 Acquired absence of other specified parts of digestive tract: Secondary | ICD-10-CM | POA: Diagnosis not present

## 2017-09-17 DIAGNOSIS — M2550 Pain in unspecified joint: Secondary | ICD-10-CM | POA: Diagnosis not present

## 2017-09-17 DIAGNOSIS — Z87442 Personal history of urinary calculi: Secondary | ICD-10-CM | POA: Insufficient documentation

## 2017-09-17 DIAGNOSIS — Z87891 Personal history of nicotine dependence: Secondary | ICD-10-CM | POA: Insufficient documentation

## 2017-09-17 DIAGNOSIS — R748 Abnormal levels of other serum enzymes: Secondary | ICD-10-CM | POA: Diagnosis not present

## 2017-09-17 DIAGNOSIS — Z8719 Personal history of other diseases of the digestive system: Secondary | ICD-10-CM | POA: Diagnosis not present

## 2017-09-17 DIAGNOSIS — Z801 Family history of malignant neoplasm of trachea, bronchus and lung: Secondary | ICD-10-CM | POA: Diagnosis not present

## 2017-09-17 DIAGNOSIS — Z79899 Other long term (current) drug therapy: Secondary | ICD-10-CM | POA: Insufficient documentation

## 2017-09-17 DIAGNOSIS — F419 Anxiety disorder, unspecified: Secondary | ICD-10-CM | POA: Insufficient documentation

## 2017-09-17 DIAGNOSIS — Z86010 Personal history of colonic polyps: Secondary | ICD-10-CM | POA: Diagnosis not present

## 2017-09-17 DIAGNOSIS — I10 Essential (primary) hypertension: Secondary | ICD-10-CM | POA: Insufficient documentation

## 2017-09-17 DIAGNOSIS — R5383 Other fatigue: Secondary | ICD-10-CM | POA: Diagnosis not present

## 2017-09-17 NOTE — Progress Notes (Signed)
Patient states that he is feeling very well today. He denies having any pain. He is anxious about getting his lab results. He has not received the Flu Vaccine yet.

## 2017-09-17 NOTE — Progress Notes (Signed)
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 He was [facility] in December 2017 and underwent a repeat colonoscopy. At that time he was found to have a normal AST but a mildly elevated ALT. Hepatitis A, B, and C testing was negative. His ferritin was elevated at 1150. And serum iron was elevated at 243. 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   Patient reports some fatigue and joint pain in his b/l knees and right ankle. He is a borderline diabetic and is not currently on any meds for the same. No known family h/o HH or liver/heart disorders. He has 2 sisters and 1 brothers who have not yet been tested. He has an adopted son. Denies any cardiac complaints. Reports decreased interest in sexual functions over last few years  Patient was started on weekly phlebotomies starting to 12/13/2016. Phlebotomy was stopped after ferritin was <50. Currently he is in maintenance phase with a goal to keep ferritin between 50-100  Interval history- he is doing well and reports that his fatigue has improved. Denies any complaints today  ECOG PS- 0 Pain scale- 0   Review of systems- Review of Systems  Constitutional: Negative for chills, fever, malaise/fatigue and weight loss.  HENT: Negative for congestion, ear discharge and  nosebleeds.   Eyes: Negative for blurred vision.  Respiratory: Negative for cough, hemoptysis, sputum production, shortness of breath and wheezing.   Cardiovascular: Negative for chest pain, palpitations, orthopnea and claudication.  Gastrointestinal: Negative for abdominal pain, blood in stool, constipation, diarrhea, heartburn, melena, nausea and vomiting.  Genitourinary: Negative for dysuria, flank pain, frequency, hematuria and urgency.  Musculoskeletal: Negative for back pain, joint pain and myalgias.  Skin: Negative for rash.  Neurological: Negative for dizziness, tingling, focal weakness, seizures, weakness and headaches.  Endo/Heme/Allergies: Does not bruise/bleed easily.  Psychiatric/Behavioral: Negative for depression and suicidal ideas. The patient does not have insomnia.       Allergies  Allergen Reactions  . Penicillin G Anaphylaxis  . Penicillins Anaphylaxis    Has patient had a PCN reaction causing immediate rash, facial/tongue/throat swelling, SOB or lightheadedness with hypotension: Yes Has patient had a PCN reaction causing severe rash involving mucus membranes or skin necrosis: No Has patient had a PCN reaction that required hospitalization Yes Has patient had a PCN reaction occurring within the last 10 years: No If all of the above answers are "NO", then may proceed with Cephalosporin use.      Past Medical History:  Diagnosis Date  . Anxiety   . Hypertension   . Kidney stone   . Skin cancer    squamous cell on head, chest and arms     Past Surgical History:  Procedure Laterality Date  . APPENDECTOMY    . arthoscopy Bilateral   . CHOLECYSTECTOMY    . NASAL FRACTURE SURGERY    . TONSILLECTOMY      Social History  Social History  . Marital status: Married    Spouse name: N/A  . Number of children: N/A  . Years of education: N/A   Occupational History  . Not on file.   Social History Main Topics  . Smoking status: Former Smoker     Packs/day: 0.25    Years: 30.00    Quit date: 11/24/1994  . Smokeless tobacco: Never Used  . Alcohol use Yes     Comment: occasional wine,beer  . Drug use: No  . Sexual activity: Not on file   Other Topics Concern  . Not on file   Social History Narrative  . No narrative on file    Family History  Problem Relation Age of Onset  . Lung cancer Father   . Ovarian cancer Sister   . Diabetes Sister   . Kidney cancer Maternal Grandmother   . Diabetes Maternal Grandmother   . Heart disease Maternal Grandmother      Current Outpatient Prescriptions:  .  ibuprofen (ADVIL,MOTRIN) 200 MG tablet, Take 400 mg by mouth every 6 (six) hours as needed., Disp: , Rfl:  .  loratadine (CLARITIN) 10 MG tablet, Take 10 mg by mouth daily as needed., Disp: , Rfl:  .  olmesartan (BENICAR) 20 MG tablet, Take 20 mg by mouth daily., Disp: , Rfl:  .  omeprazole (PRILOSEC) 20 MG capsule, Take 20 mg by mouth daily., Disp: , Rfl:  .  sertraline (ZOLOFT) 50 MG tablet, TAKE 1 TABLET (50 MG TOTAL) BY MOUTH ONCE DAILY., Disp: , Rfl: 11  Physical exam:  Vitals:   09/17/17 1353  BP: 120/80  Pulse: 87  Temp: (!) 97 F (36.1 C)  TempSrc: Tympanic  Weight: 160 lb (72.6 kg)   Physical Exam  Constitutional: He is oriented to person, place, and time and well-developed, well-nourished, and in no distress.  HENT:  Head: Normocephalic and atraumatic.  Eyes: Pupils are equal, round, and reactive to light. EOM are normal.  Neck: Normal range of motion.  Cardiovascular: Normal rate, regular rhythm and normal heart sounds.   Pulmonary/Chest: Effort normal and breath sounds normal.  Abdominal: Soft. Bowel sounds are normal.  Neurological: He is alert and oriented to person, place, and time.  Skin: Skin is warm and dry.     CMP Latest Ref Rng & Units 10/05/2016  Glucose 65 - 99 mg/dL 142(H)  BUN 6 - 20 mg/dL 14  Creatinine 0.61 - 1.24 mg/dL 1.01  Sodium 135 - 145 mmol/L 135  Potassium 3.5 - 5.1 mmol/L 3.8    Chloride 101 - 111 mmol/L 104  CO2 22 - 32 mmol/L 23  Calcium 8.9 - 10.3 mg/dL 8.8(L)  Total Protein 6.5 - 8.1 g/dL 7.4  Total Bilirubin 0.3 - 1.2 mg/dL 1.1  Alkaline Phos 38 - 126 U/L 52  AST 15 - 41 U/L 25  ALT 17 - 63 U/L 39   CBC Latest Ref Rng & Units 10/05/2016  WBC 3.8 - 10.6 K/uL 11.3(H)  Hemoglobin 13.0 - 18.0 g/dL 14.8  Hematocrit 40.0 - 52.0 % 43.7  Platelets 150 - 440 K/uL 249    Assessment and plan- Patient is a 66 y.o. male with a history of hereditary hemochromatosis with homozygosity for C282Y mutation s/p phlebotomy in the past  Blood work from 09/15/2017 showed white count of 6.9, H&H of 15.6/45.1 and a platelet count of 246. CMP was within normal limits. Iron studies showed iron saturation was mildly elevated at 57% and serum ferritin of 67.  Given that his ferritin is <100 he does not require a phlebotomy at this time. I will repeat CBC ferritin and iron studies and CMP in 3 months and 6 months and I will see him back in 6 months for possible phlebotomy. He [facility] surveillance   Visit Diagnosis 1. Hereditary hemochromatosis ([place])      Dr. Randa Evens, MD, MPH [facility] at [facility] Pager- [phone] 09/17/2017 3:41 PM

## 2017-09-26 ENCOUNTER — Encounter: Payer: Self-pay | Admitting: Oncology

## 2017-09-30 ENCOUNTER — Encounter: Payer: Self-pay | Admitting: Oncology

## 2017-11-27 IMAGING — CT CT RENAL STONE PROTOCOL
2 of 4 series · 15 of 46 positions shown, 17 images · non-contrast
Comparison: Prior CT from 12/17/2011.

CLINICAL DATA: Initial evaluation for kidney stones.

EXAM:
CT ABDOMEN AND PELVIS WITHOUT CONTRAST
TECHNIQUE: Multidetector CT imaging of the abdomen and pelvis was performed
following the standard protocol without IV contrast.

[Series 2: axial st · axial · 0.78mm/px · z∈[-1254,-824]mm · 12 of 98 slices shown, 14 images]
[im 8/98  soft-tissue]
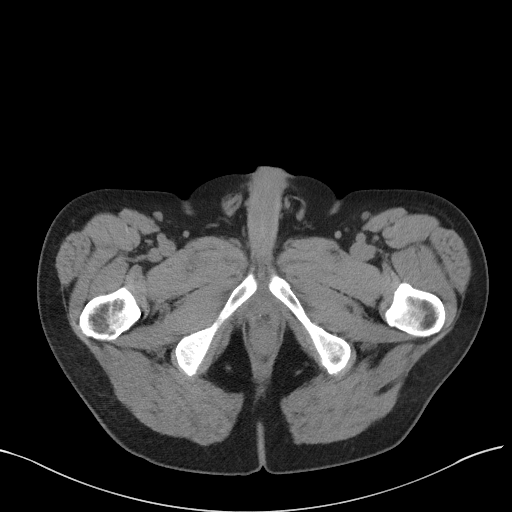
[im 8/98  bone]
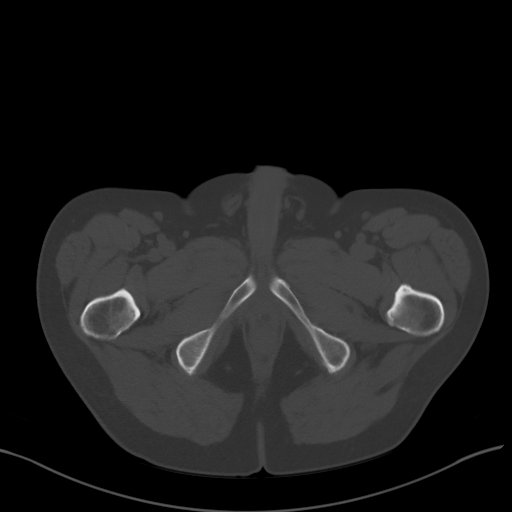
[im 16/98  soft-tissue]
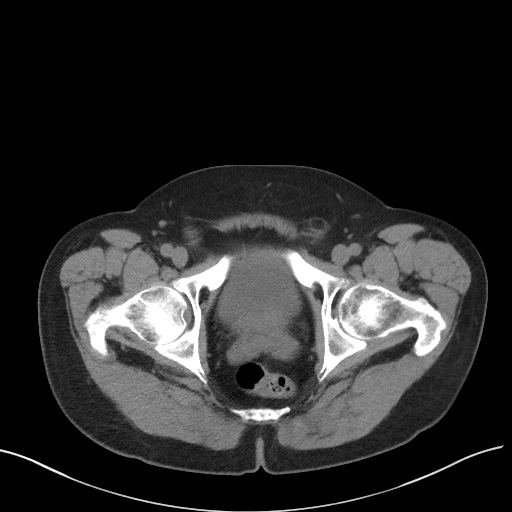
[im 24/98  soft-tissue]
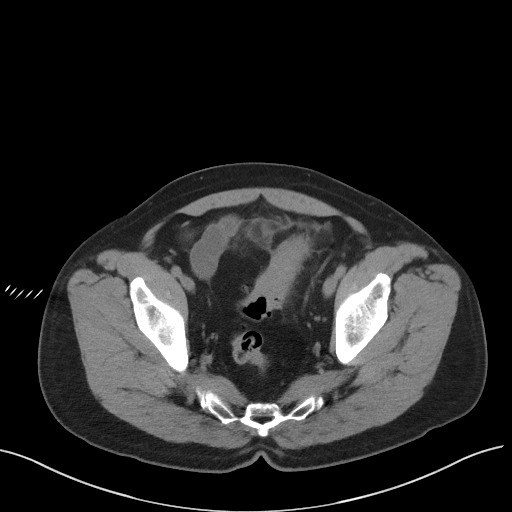
[im 32/98  soft-tissue]
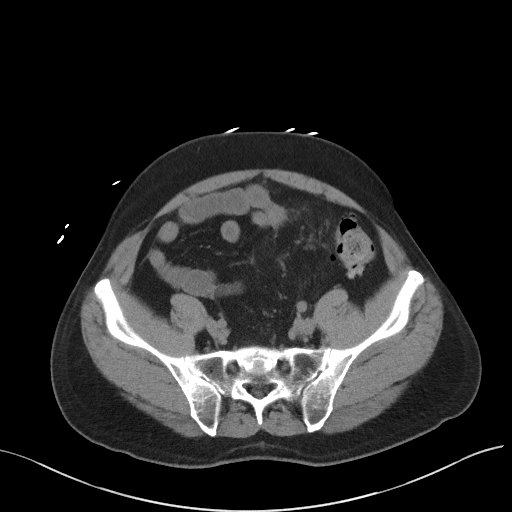
[im 39/98  soft-tissue]
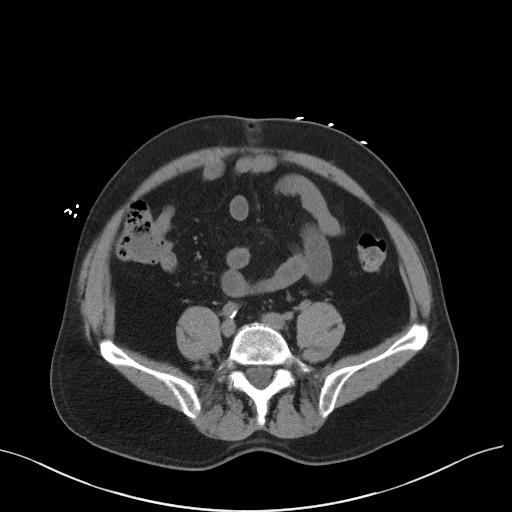
[im 47/98  soft-tissue]
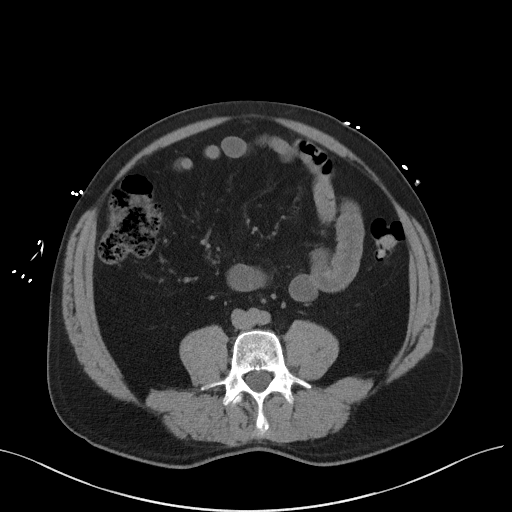
[im 55/98  soft-tissue]
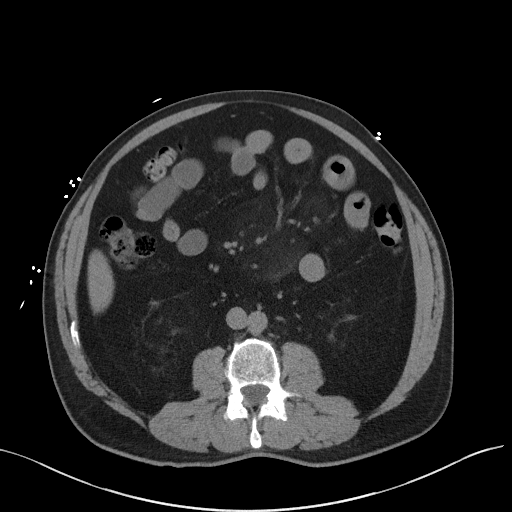
[im 63/98  soft-tissue]
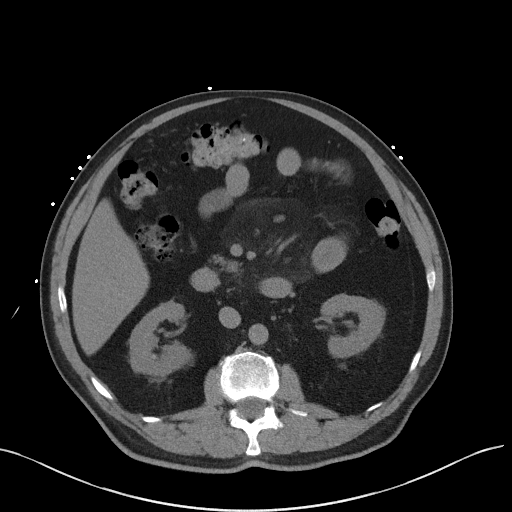
[im 70/98  soft-tissue]
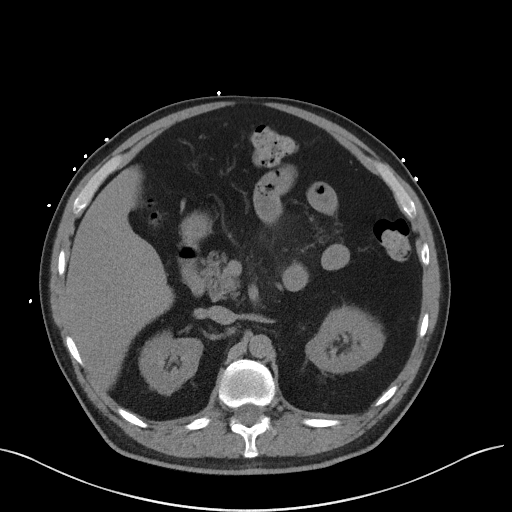
[im 70/98  bone]
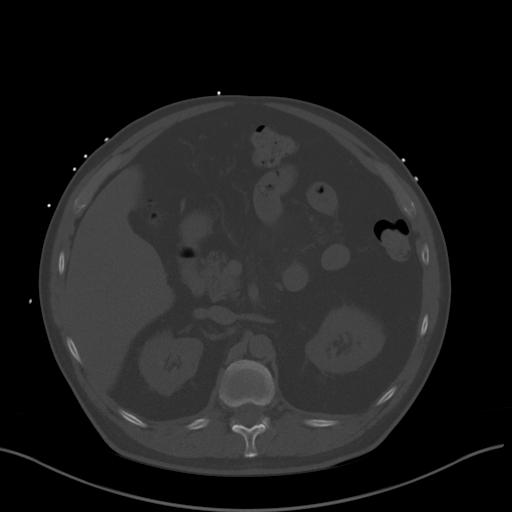
[im 78/98  soft-tissue]
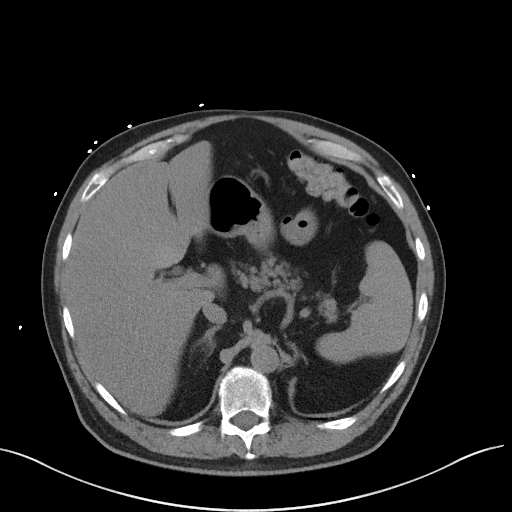
[im 86/98  soft-tissue]
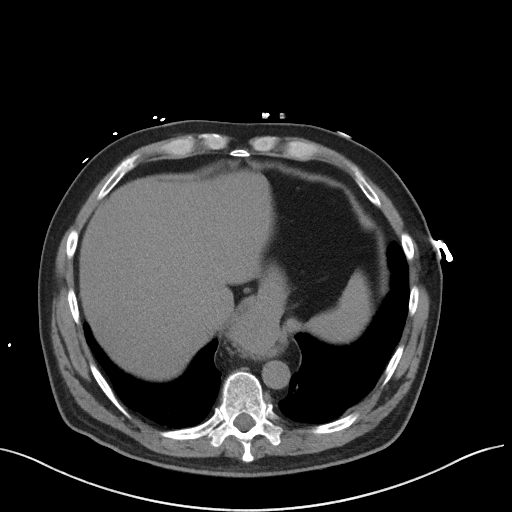
[im 94/98  soft-tissue]
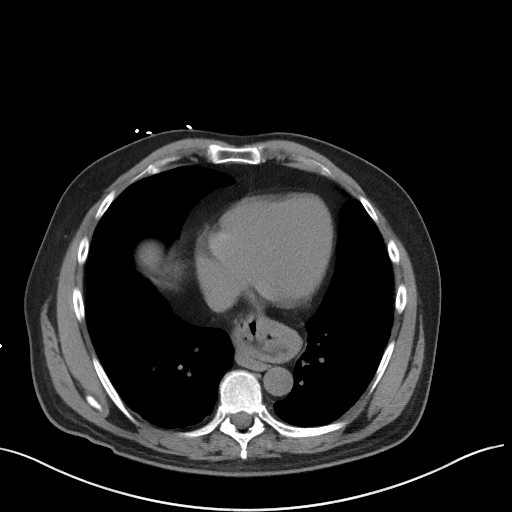

[Series 5: coronal · coronal · 0.65mm/px · 3 of 147 slices shown]
[im 49/147  soft-tissue]
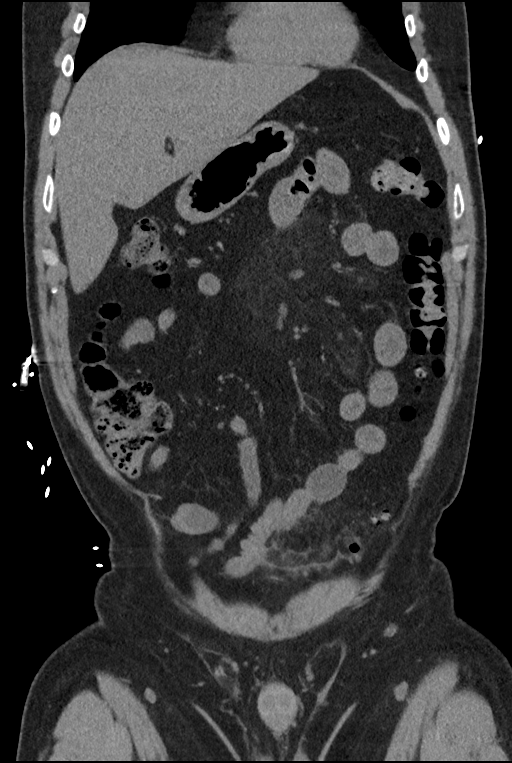
[im 65/147  soft-tissue]
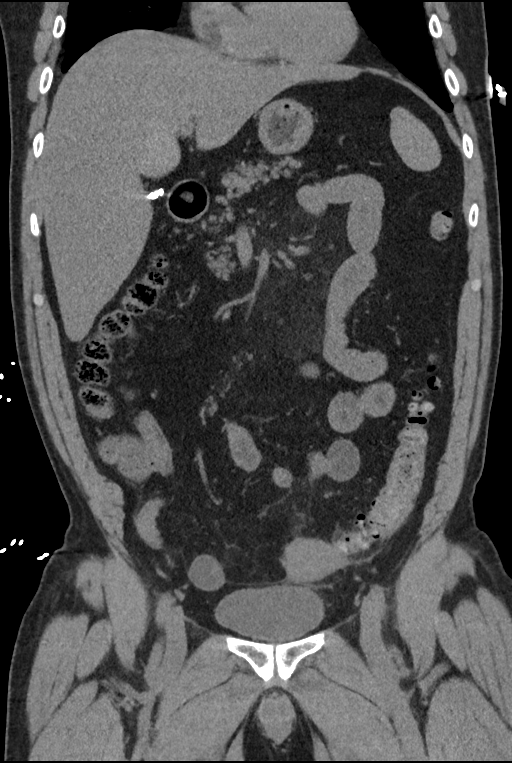
[im 82/147  soft-tissue]
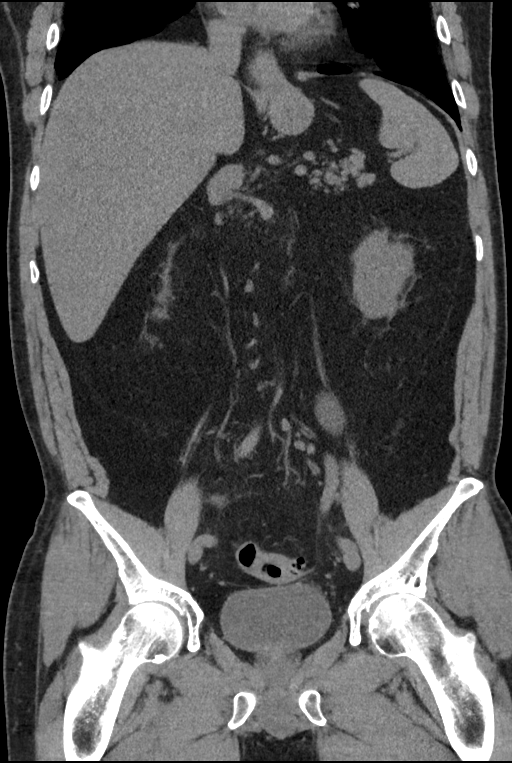

[15 of 46 positions shown; findings below may reference images not displayed]

FINDINGS: Lower chest: Mild subsegmental atelectasis seen dependently within
the visualized lung bases. Visualized lungs are otherwise clear.
Bochdalek's type hernia noted at the left lung base.

Hepatobiliary: The liver demonstrates a normal unenhanced
appearance. Gallbladder surgically absent. No biliary dilatation.

Pancreas: Pancreas within normal limits.

Spleen: Subcentimeter hypodensity noted within the anterior aspect
of the spleen, not well characterized on this noncontrast
examination, but of doubtful clinical significance. Spleen otherwise
unremarkable.

Adrenals/Urinary Tract: Adrenal glands are normal. Few scattered
punctate calcific densities present within the kidneys bilaterally,
suspicious for small nonobstructive calculi. No radiopaque stones
present along the course of either ureter. There is no hydroureter.
Small bilateral diverticula noted at the UVJs bilaterally. No
radiopaque stones seen within the bladder lumen. Bladder itself
within normal limits.

Stomach/Bowel: Large hiatal hernia without associated inflammation.
Stomach otherwise unremarkable. No evidence for bowel obstruction.
Multiple sigmoid diverticula present. There is inflammatory
stranding about several of these diverticular within the central/
left lower abdomen, compatible with acute diverticulitis. No other
acute inflammatory changes seen about the bowels. Appendix not
visualize, compatible with prior appendectomy.

Vascular/Lymphatic: Aorta of normal caliber. Minimal aorto bi-iliac
atherosclerotic disease. No adenopathy.

Reproductive: Prostate normal.

Other: No free intraperitoneal air. No free fluid. Small bilateral
fat containing inguinal hernias noted.

Musculoskeletal: No acute osseous abnormality. No worrisome lytic or
blastic osseous lesions.
IMPRESSION: 1. Findings consistent with acute sigmoid diverticulitis. No
complication identified.
2. Probable small punctate nonobstructive nephrolithiasis
bilaterally. No CT evidence for obstructive uropathy.
3. No other acute intra-abdominal or pelvic process identified.

## 2017-12-18 ENCOUNTER — Telehealth: Payer: Self-pay | Admitting: *Deleted

## 2017-12-18 NOTE — Telephone Encounter (Signed)
Patient called and states he forgot to get his labs and that he will go Monday to get them and he still has the form for it. He just wanted you to know

## 2017-12-18 NOTE — Telephone Encounter (Signed)
It is his 3 months labs and it is ok. thanks

## 2017-12-22 ENCOUNTER — Other Ambulatory Visit: Payer: Self-pay | Admitting: *Deleted

## 2017-12-23 ENCOUNTER — Telehealth: Payer: Self-pay | Admitting: *Deleted

## 2017-12-23 NOTE — Telephone Encounter (Signed)
Called pt to let him know based on ferritin less than 100 he does not need phlebotomy. I will show md and if any changes I will call and let him know. He is agreeable to plan

## 2017-12-23 NOTE — Telephone Encounter (Signed)
calledg for results. Please return call. I do not see any labs in computer, so ? [facility]

## 2017-12-24 ENCOUNTER — Encounter: Payer: Self-pay | Admitting: Oncology

## 2018-03-18 ENCOUNTER — Inpatient Hospital Stay: Payer: Medicare Other | Attending: Oncology | Admitting: Oncology

## 2018-03-18 ENCOUNTER — Encounter: Payer: Self-pay | Admitting: Oncology

## 2018-03-18 ENCOUNTER — Inpatient Hospital Stay: Payer: Medicare Other

## 2018-03-18 DIAGNOSIS — Z87442 Personal history of urinary calculi: Secondary | ICD-10-CM | POA: Insufficient documentation

## 2018-03-18 DIAGNOSIS — Z8041 Family history of malignant neoplasm of ovary: Secondary | ICD-10-CM | POA: Insufficient documentation

## 2018-03-18 DIAGNOSIS — Z86010 Personal history of colonic polyps: Secondary | ICD-10-CM | POA: Diagnosis not present

## 2018-03-18 DIAGNOSIS — Z79899 Other long term (current) drug therapy: Secondary | ICD-10-CM | POA: Insufficient documentation

## 2018-03-18 DIAGNOSIS — Z85828 Personal history of other malignant neoplasm of skin: Secondary | ICD-10-CM | POA: Diagnosis not present

## 2018-03-18 DIAGNOSIS — Z801 Family history of malignant neoplasm of trachea, bronchus and lung: Secondary | ICD-10-CM | POA: Insufficient documentation

## 2018-03-18 DIAGNOSIS — Z9049 Acquired absence of other specified parts of digestive tract: Secondary | ICD-10-CM | POA: Insufficient documentation

## 2018-03-18 DIAGNOSIS — M2550 Pain in unspecified joint: Secondary | ICD-10-CM | POA: Insufficient documentation

## 2018-03-18 DIAGNOSIS — R5383 Other fatigue: Secondary | ICD-10-CM | POA: Insufficient documentation

## 2018-03-18 DIAGNOSIS — R740 Nonspecific elevation of levels of transaminase and lactic acid dehydrogenase [LDH]: Secondary | ICD-10-CM | POA: Diagnosis not present

## 2018-03-18 DIAGNOSIS — Z8719 Personal history of other diseases of the digestive system: Secondary | ICD-10-CM | POA: Insufficient documentation

## 2018-03-18 DIAGNOSIS — F419 Anxiety disorder, unspecified: Secondary | ICD-10-CM | POA: Insufficient documentation

## 2018-03-18 DIAGNOSIS — I10 Essential (primary) hypertension: Secondary | ICD-10-CM | POA: Diagnosis not present

## 2018-03-18 DIAGNOSIS — Z87891 Personal history of nicotine dependence: Secondary | ICD-10-CM | POA: Diagnosis not present

## 2018-03-18 NOTE — Progress Notes (Signed)
Pt in for follow up, labs drawn at [place] yesterday.  Pt denies any concerns or difficulties.

## 2018-03-19 NOTE — Progress Notes (Signed)
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 He was [facility] in December 2017 and underwent a repeat colonoscopy. At that time he was found to have a normal AST but a mildly elevated ALT. Hepatitis A, B, and C testing was negative. His ferritin was elevated at 1150. And serum iron was elevated at 243. 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   Patient reports some fatigue and joint pain in his b/l knees and right ankle. He is a borderline diabetic and is not currently on any meds for the same. No known family h/o HH or liver/heart disorders. He has 2 sisters and 1 brothers who have not yet been tested. He has an adopted son. Denies any cardiac complaints. Reports decreased interest in sexual functions over last few years  Patient was started on weekly phlebotomies starting to 12/13/2016. Phlebotomy was stopped after ferritin was <50. Currently he is in maintenance phase with a goal to keep ferritin between 50-100. He has not require phlebotomy for a year now  Interval history- he is doing well. Does report fatigue after heavy physcial activity but denies other complaints. He sees [facility] for surveillance for [place]  ECOG PS- 0 Pain scale- 0   Review of systems- Review of Systems  Constitutional: Positive for  malaise/fatigue. Negative for chills, fever and weight loss.  HENT: Negative for congestion, ear discharge and nosebleeds.   Eyes: Negative for blurred vision.  Respiratory: Negative for cough, hemoptysis, sputum production, shortness of breath and wheezing.   Cardiovascular: Negative for chest pain, palpitations, orthopnea and claudication.  Gastrointestinal: Negative for abdominal pain, blood in stool, constipation, diarrhea, heartburn, melena, nausea and vomiting.  Genitourinary: Negative for dysuria, flank pain, frequency, hematuria and urgency.  Musculoskeletal: Negative for back pain, joint pain and myalgias.  Skin: Negative for rash.  Neurological: Negative for dizziness, tingling, focal weakness, seizures, weakness and headaches.  Endo/Heme/Allergies: Does not bruise/bleed easily.  Psychiatric/Behavioral: Negative for depression and suicidal ideas. The patient does not have insomnia.       Allergies  Allergen Reactions  . Penicillin G Anaphylaxis  . Penicillins Anaphylaxis    Has patient had a PCN reaction causing immediate rash, facial/tongue/throat swelling, SOB or lightheadedness with hypotension: Yes Has patient had a PCN reaction causing severe rash involving mucus membranes or skin necrosis: No Has patient had a PCN reaction that required hospitalization Yes Has patient had a PCN reaction occurring within the last 10 years: No If all of the above answers are "NO", then may proceed with Cephalosporin use.   . Ciprofloxacin     Had severe vomiting     Past Medical History:  Diagnosis Date  . Anxiety   . Hypertension   . Kidney stone   . Skin cancer    squamous cell on head, chest and arms     Past Surgical History:  Procedure Laterality Date  . APPENDECTOMY    .  arthoscopy Bilateral   . CHOLECYSTECTOMY    . NASAL FRACTURE SURGERY    . TONSILLECTOMY      Social History   Socioeconomic History  . Marital status: Married    Spouse name: Not on file  .  Number of children: Not on file  . Years of education: Not on file  . Highest education level: Not on file  Occupational History  . Not on file  Social Needs  . Financial resource strain: Not on file  . Food insecurity:    Worry: Not on file    Inability: Not on file  . Transportation needs:    Medical: Not on file    Non-medical: Not on file  Tobacco Use  . Smoking status: Former Smoker    Packs/day: 0.25    Years: 30.00    Pack years: 7.50    Last attempt to quit: 11/24/1994    Years since quitting: 23.3  . Smokeless tobacco: Never Used  Substance and Sexual Activity  . Alcohol use: Yes    Comment: occasional wine,beer  . Drug use: No  . Sexual activity: Not on file  Lifestyle  . Physical activity:    Days per week: Not on file    Minutes per session: Not on file  . Stress: Not on file  Relationships  . Social connections:    Talks on phone: Not on file    Gets together: Not on file    Attends religious service: Not on file    Active member of club or organization: Not on file    Attends meetings of clubs or organizations: Not on file    Relationship status: Not on file  . Intimate partner violence:    Fear of current or ex partner: Not on file    Emotionally abused: Not on file    Physically abused: Not on file    Forced sexual activity: Not on file  Other Topics Concern  . Not on file  Social History Narrative  . Not on file    Family History  Problem Relation Age of Onset  . Lung cancer Father   . Ovarian cancer Sister   . Diabetes Sister   . Kidney cancer Maternal Grandmother   . Diabetes Maternal Grandmother   . Heart disease Maternal Grandmother      Current Outpatient Medications:  .  ibuprofen (ADVIL,MOTRIN) 200 MG tablet, Take 400 mg by mouth every 6 (six) hours as needed., Disp: , Rfl:  .  loratadine (CLARITIN) 10 MG tablet, Take 10 mg by mouth daily as needed., Disp: , Rfl:  .  olmesartan (BENICAR) 20 MG tablet, Take 20 mg by mouth daily.,  Disp: , Rfl:  .  RABEprazole (ACIPHEX) 20 MG tablet, Take 20 mg by mouth daily., Disp: , Rfl:  .  sertraline (ZOLOFT) 50 MG tablet, TAKE 1 TABLET (50 MG TOTAL) BY MOUTH ONCE DAILY., Disp: , Rfl: 11  Physical exam:  Vitals:   03/18/18 1348  BP: 113/73  Pulse: 84  Resp: 18  Temp: 98 F (36.7 C)  TempSrc: Tympanic  SpO2: 97%  Weight: 162 lb 1 oz (73.5 kg)   Physical Exam  Constitutional: He is oriented to person, place, and time. He appears well-developed and well-nourished.  HENT:  Head: Normocephalic and atraumatic.  Eyes: Pupils are equal, round, and reactive to light. EOM are normal.  Neck: Normal range of motion.  Cardiovascular: Normal rate, regular rhythm and normal heart sounds.  Pulmonary/Chest: Effort normal  and breath sounds normal.  Abdominal: Soft. Bowel sounds are normal.  Neurological: He is alert and oriented to person, place, and time.  Skin: Skin is warm and dry.     CMP Latest Ref Rng & Units 10/05/2016  Glucose 65 - 99 mg/dL 142(H)  BUN 6 - 20 mg/dL 14  Creatinine 0.61 - 1.24 mg/dL 1.01  Sodium 135 - 145 mmol/L 135  Potassium 3.5 - 5.1 mmol/L 3.8  Chloride 101 - 111 mmol/L 104  CO2 22 - 32 mmol/L 23  Calcium 8.9 - 10.3 mg/dL 8.8(L)  Total Protein 6.5 - 8.1 g/dL 7.4  Total Bilirubin 0.3 - 1.2 mg/dL 1.1  Alkaline Phos 38 - 126 U/L 52  AST 15 - 41 U/L 25  ALT 17 - 63 U/L 39   CBC Latest Ref Rng & Units 10/05/2016  WBC 3.8 - 10.6 K/uL 11.3(H)  Hemoglobin 13.0 - 18.0 g/dL 14.8  Hematocrit 40.0 - 52.0 % 43.7  Platelets 150 - 440 K/uL 249     Assessment and plan- Patient is a 67 y.o. male with ehreditary hemochromatosis with C282Y homozygocity here for routine follow up and possible phlebotomy  Labs done at [facility] shows white count of 6.3, H&H of 16/46.6 with a platelet count of 245.  His CMP is within normal limits.  Ferritin is 60 and iron saturation is 41%.  Since his ferritin is less than 100 he does not need phlebotomy at this time  Repeat CBC  ferritin and iron studies in 3 in 6 months and I will see him back in 6 months for possible phlebotomy.  Labs to be done at [facility]   Visit Diagnosis 1. Hereditary hemochromatosis ([place])      Dr. Randa Evens, MD, MPH [facility] at [facility] [phone] 03/19/2018 8:15 AM

## 2018-04-28 ENCOUNTER — Other Ambulatory Visit: Payer: Self-pay | Admitting: Student

## 2018-04-28 DIAGNOSIS — K760 Fatty (change of) liver, not elsewhere classified: Secondary | ICD-10-CM

## 2018-05-06 ENCOUNTER — Ambulatory Visit
Admission: RE | Admit: 2018-05-06 | Discharge: 2018-05-06 | Disposition: A | Discharge status: Home / Self Care | Payer: Medicare Other | Source: Ambulatory Visit | Attending: Student | Admitting: Student

## 2018-05-06 DIAGNOSIS — Z9049 Acquired absence of other specified parts of digestive tract: Secondary | ICD-10-CM | POA: Insufficient documentation

## 2018-05-06 DIAGNOSIS — K760 Fatty (change of) liver, not elsewhere classified: Secondary | ICD-10-CM | POA: Diagnosis present

## 2018-06-10 ENCOUNTER — Other Ambulatory Visit: Payer: Self-pay | Admitting: Student

## 2018-06-10 ENCOUNTER — Other Ambulatory Visit
Admission: RE | Admit: 2018-06-10 | Discharge: 2018-06-10 | Disposition: A | Discharge status: Home / Self Care | Payer: Medicare Other | Source: Ambulatory Visit | Attending: Student | Admitting: Student

## 2018-06-10 ENCOUNTER — Ambulatory Visit
Admission: RE | Admit: 2018-06-10 | Discharge: 2018-06-10 | Disposition: A | Discharge status: Home / Self Care | Payer: Medicare Other | Source: Ambulatory Visit | Attending: Student | Admitting: Student

## 2018-06-10 DIAGNOSIS — K56699 Other intestinal obstruction unspecified as to partial versus complete obstruction: Secondary | ICD-10-CM | POA: Diagnosis not present

## 2018-06-10 DIAGNOSIS — R634 Abnormal weight loss: Secondary | ICD-10-CM

## 2018-06-10 DIAGNOSIS — R1084 Generalized abdominal pain: Secondary | ICD-10-CM

## 2018-06-10 DIAGNOSIS — R630 Anorexia: Secondary | ICD-10-CM | POA: Diagnosis not present

## 2018-06-10 LAB — POCT I-STAT CREATININE: Creatinine, Ser: 1.1 mg/dL (ref 0.61–1.24)

## 2018-06-10 MED ORDER — IOHEXOL 300 MG/ML  SOLN
100.0000 mL | Freq: Once | INTRAMUSCULAR | Status: AC | PRN
  Administered 2018-06-10: 100 mL via INTRAVENOUS

## 2018-06-11 ENCOUNTER — Encounter: Payer: Self-pay | Admitting: Oncology

## 2018-07-05 ENCOUNTER — Ambulatory Visit
Admission: RE | Admit: 2018-07-05 | Discharge: 2018-07-05 | Disposition: A | Discharge status: Home / Self Care | Payer: Medicare Other | Source: Ambulatory Visit | Attending: Student | Admitting: Student

## 2018-07-05 ENCOUNTER — Other Ambulatory Visit: Payer: Self-pay | Admitting: Student

## 2018-07-05 DIAGNOSIS — R1032 Left lower quadrant pain: Secondary | ICD-10-CM

## 2018-07-05 DIAGNOSIS — I700 Atherosclerosis of aorta: Secondary | ICD-10-CM | POA: Insufficient documentation

## 2018-07-05 DIAGNOSIS — R933 Abnormal findings on diagnostic imaging of other parts of digestive tract: Secondary | ICD-10-CM | POA: Diagnosis not present

## 2018-07-05 DIAGNOSIS — K5730 Diverticulosis of large intestine without perforation or abscess without bleeding: Secondary | ICD-10-CM | POA: Insufficient documentation

## 2018-07-05 DIAGNOSIS — K769 Liver disease, unspecified: Secondary | ICD-10-CM | POA: Diagnosis not present

## 2018-07-05 MED ORDER — IOPAMIDOL (ISOVUE-300) INJECTION 61%
100.0000 mL | Freq: Once | INTRAVENOUS | Status: AC | PRN
  Administered 2018-07-05: 100 mL via INTRAVENOUS

## 2018-09-15 ENCOUNTER — Encounter: Payer: Self-pay | Admitting: Oncology

## 2018-09-16 ENCOUNTER — Encounter: Payer: Self-pay | Admitting: Oncology

## 2018-09-16 ENCOUNTER — Inpatient Hospital Stay: Payer: Medicare Other | Attending: Oncology | Admitting: Oncology

## 2018-09-16 ENCOUNTER — Inpatient Hospital Stay: Payer: Medicare Other

## 2018-09-16 DIAGNOSIS — R740 Nonspecific elevation of levels of transaminase and lactic acid dehydrogenase [LDH]: Secondary | ICD-10-CM | POA: Diagnosis not present

## 2018-09-16 DIAGNOSIS — Z85828 Personal history of other malignant neoplasm of skin: Secondary | ICD-10-CM

## 2018-09-16 DIAGNOSIS — Z801 Family history of malignant neoplasm of trachea, bronchus and lung: Secondary | ICD-10-CM | POA: Diagnosis not present

## 2018-09-16 DIAGNOSIS — Z23 Encounter for immunization: Secondary | ICD-10-CM

## 2018-09-16 DIAGNOSIS — Z86010 Personal history of colonic polyps: Secondary | ICD-10-CM | POA: Diagnosis not present

## 2018-09-16 DIAGNOSIS — Z8041 Family history of malignant neoplasm of ovary: Secondary | ICD-10-CM | POA: Insufficient documentation

## 2018-09-16 DIAGNOSIS — K5732 Diverticulitis of large intestine without perforation or abscess without bleeding: Secondary | ICD-10-CM | POA: Diagnosis not present

## 2018-09-16 DIAGNOSIS — I10 Essential (primary) hypertension: Secondary | ICD-10-CM | POA: Diagnosis not present

## 2018-09-16 DIAGNOSIS — Z87891 Personal history of nicotine dependence: Secondary | ICD-10-CM

## 2018-09-16 DIAGNOSIS — Z87442 Personal history of urinary calculi: Secondary | ICD-10-CM | POA: Diagnosis not present

## 2018-09-16 DIAGNOSIS — M25571 Pain in right ankle and joints of right foot: Secondary | ICD-10-CM | POA: Insufficient documentation

## 2018-09-16 DIAGNOSIS — M25562 Pain in left knee: Secondary | ICD-10-CM | POA: Insufficient documentation

## 2018-09-16 DIAGNOSIS — Z79899 Other long term (current) drug therapy: Secondary | ICD-10-CM | POA: Insufficient documentation

## 2018-09-16 DIAGNOSIS — F419 Anxiety disorder, unspecified: Secondary | ICD-10-CM | POA: Diagnosis not present

## 2018-09-16 MED ORDER — INFLUENZA VAC SPLIT HIGH-DOSE 0.5 ML IM SUSY: 0.5000 mL | PREFILLED_SYRINGE | INTRAMUSCULAR | Status: DC

## 2018-09-16 MED ORDER — INFLUENZA VAC SPLIT HIGH-DOSE 0.5 ML IM SUSY
0.5000 mL | PREFILLED_SYRINGE | INTRAMUSCULAR | Status: AC
  Administered 2018-09-16: 0.5 mL via INTRAMUSCULAR

## 2018-09-16 NOTE — Progress Notes (Signed)
No new changes noted today 

## 2018-09-17 ENCOUNTER — Telehealth: Payer: Self-pay

## 2018-09-17 NOTE — Telephone Encounter (Signed)
Dr Janese Banks last office note for the patient was faxed to [facility] at [facility]. Fax confirmation confirmed.

## 2018-09-17 NOTE — Progress Notes (Signed)
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 He was [facility] in December 2017 and underwent a repeat colonoscopy. At that time he was found to have a normal AST but a mildly elevated ALT. Hepatitis A, B, and C testing was negative. His ferritin was elevated at 1150. And serum iron was elevated at 243. 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   Patient reports some fatigue and joint pain in his b/l knees and right ankle. He is a borderline diabetic and is not currently on any meds for the same. No known family h/o HH or liver/heart disorders. He has 2 sisters and 1 brothers who have not yet been tested. He has an adopted son. Denies any cardiac complaints. Reports decreased interest in sexual functions over last few years  Patient was started on weekly phlebotomies starting to 12/13/2016. Phlebotomy was stopped after ferritin was <50. Currently he is in maintenance phase with a goal to keep ferritin between 50-100.  Patient has not required phlebotomy for over a year and a half  Interval history-over the last 6 months patient has had some symptoms of left lower quadrant abdominal pain.  He did undergo CT scan July 2019 which showed possible evidence of sigmoid diverticulitis.  He was seen at [place] and was erroneously prescribed Levaquin 750  mg 3 times a day.  He then went to [facility] and was later seen by Dr. Lisabeth Devoid from colorectal surgery.  He also had a repeat CT scan in September 2019 which showed mild ongoing inflammation due to sigmoid diverticulitis.  Endoscopy suggested following improvement to exclude the possibility of an underlying mass.  Patient denies any fevers but has mild intermittent abdominal pain.  He is currently off antibiotics  ECOG PS- 1 Pain scale- 0 Opioid associated constipation- no  Review of systems- Review of Systems  Constitutional: Negative for chills, fever, malaise/fatigue and weight loss.  HENT: Negative for congestion, ear discharge and nosebleeds.   Eyes: Negative for blurred vision.  Respiratory: Negative for cough, hemoptysis, sputum production, shortness of breath and wheezing.   Cardiovascular: Negative for chest pain, palpitations, orthopnea and claudication.  Gastrointestinal: Positive for abdominal pain. Negative for blood in stool, constipation, diarrhea, heartburn, melena, nausea and vomiting.  Genitourinary: Negative for dysuria, flank pain, frequency, hematuria and urgency.  Musculoskeletal: Negative for back pain, joint pain and myalgias.  Skin: Negative for rash.  Neurological: Negative for dizziness, tingling, focal weakness, seizures, weakness and headaches.  Endo/Heme/Allergies: Does not bruise/bleed easily.  Psychiatric/Behavioral: Negative for depression and suicidal ideas. The patient does not have insomnia.       Allergies  Allergen Reactions  . Iodinated Diagnostic Agents Hives and Other (See Comments)    Sneezing Sneezing, itching, hives, lightheaded, flushed Other reaction(s): Other (See Comments) Sneezing   . Penicillin G Anaphylaxis  . Penicillins Anaphylaxis    Has patient had a PCN reaction causing immediate rash, facial/tongue/throat swelling, SOB or  lightheadedness with hypotension: Yes Has patient had a PCN reaction causing severe rash involving mucus membranes  or skin necrosis: No Has patient had a PCN reaction that required hospitalization Yes Has patient had a PCN reaction occurring within the last 10 years: No If all of the above answers are "NO", then may proceed with Cephalosporin use.   . Ciprofloxacin     Had severe vomiting     Past Medical History:  Diagnosis Date  . Anxiety   . Hypertension   . Kidney stone   . Skin cancer    squamous cell on head, chest and arms     Past Surgical History:  Procedure Laterality Date  . APPENDECTOMY    . arthoscopy Bilateral   . CHOLECYSTECTOMY    . NASAL FRACTURE SURGERY    . TONSILLECTOMY      Social History   Socioeconomic History  . Marital status: Married    Spouse name: Not on file  . Number of children: Not on file  . Years of education: Not on file  . Highest education level: Not on file  Occupational History  . Not on file  Social Needs  . Financial resource strain: Not on file  . Food insecurity:    Worry: Not on file    Inability: Not on file  . Transportation needs:    Medical: Not on file    Non-medical: Not on file  Tobacco Use  . Smoking status: Former Smoker    Packs/day: 0.25    Years: 30.00    Pack years: 7.50    Last attempt to quit: 11/24/1994    Years since quitting: 23.8  . Smokeless tobacco: Never Used  Substance and Sexual Activity  . Alcohol use: Yes    Comment: occasional wine,beer  . Drug use: No  . Sexual activity: Not on file  Lifestyle  . Physical activity:    Days per week: Not on file    Minutes per session: Not on file  . Stress: Not on file  Relationships  . Social connections:    Talks on phone: Not on file    Gets together: Not on file    Attends religious service: Not on file    Active member of club or organization: Not on file    Attends meetings of clubs or organizations: Not on file    Relationship status: Not on file  . Intimate partner violence:    Fear of current or ex partner: Not on file    Emotionally abused:  Not on file    Physically abused: Not on file    Forced sexual activity: Not on file  Other Topics Concern  . Not on file  Social History Narrative  . Not on file    Family History  Problem Relation Age of Onset  . Lung cancer Father   . Ovarian cancer Sister   . Diabetes Sister   . Kidney cancer Maternal Grandmother   . Diabetes Maternal Grandmother   . Heart disease Maternal Grandmother      Current Outpatient Medications:  .  loratadine (CLARITIN) 10 MG tablet, Take 10 mg by mouth daily as needed., Disp: , Rfl:  .  olmesartan (BENICAR) 20 MG tablet, Take 20 mg by mouth daily., Disp: , Rfl:  .  RABEprazole (ACIPHEX) 20 MG tablet, Take 20 mg by mouth daily., Disp: , Rfl:  .  sertraline (ZOLOFT) 50 MG tablet, TAKE 1 TABLET (50 MG TOTAL) BY MOUTH ONCE DAILY.,  Disp: , Rfl: 11 .  hyoscyamine (LEVSIN SL) 0.125 MG SL tablet, PLACE 1 TABLET UNDER THE TONGUE EVERY FOUR HOURS AS NEEDED FOR CRAMPING., Disp: , Rfl: 0 .  ibuprofen (ADVIL,MOTRIN) 200 MG tablet, Take 400 mg by mouth every 6 (six) hours as needed., Disp: , Rfl:   Physical exam:  Vitals:   09/16/18 1412  BP: 116/75  Resp: 18  Temp: (!) 97.3 F (36.3 C)  TempSrc: Tympanic  SpO2: 95%  Weight: 152 lb 4.8 oz (69.1 kg)  Height: 5\' 6"  (1.676 m)   Physical Exam  Constitutional: He is oriented to person, place, and time. He appears well-developed and well-nourished.  HENT:  Head: Normocephalic and atraumatic.  Eyes: Pupils are equal, round, and reactive to light. EOM are normal.  Neck: Normal range of motion.  Cardiovascular: Normal rate, regular rhythm and normal heart sounds.  Pulmonary/Chest: Effort normal and breath sounds normal.  Abdominal: Soft. Bowel sounds are normal. He exhibits no distension. There is no tenderness.  Neurological: He is alert and oriented to person, place, and time.  Skin: Skin is warm and dry.     CMP Latest Ref Rng & Units 06/10/2018  Glucose 65 - 99 mg/dL -  BUN 6 - 20 mg/dL -    Creatinine 0.61 - 1.24 mg/dL 1.10  Sodium 135 - 145 mmol/L -  Potassium 3.5 - 5.1 mmol/L -  Chloride 101 - 111 mmol/L -  CO2 22 - 32 mmol/L -  Calcium 8.9 - 10.3 mg/dL -  Total Protein 6.5 - 8.1 g/dL -  Total Bilirubin 0.3 - 1.2 mg/dL -  Alkaline Phos 38 - 126 U/L -  AST 15 - 41 U/L -  ALT 17 - 63 U/L -   CBC Latest Ref Rng & Units 10/05/2016  WBC 3.8 - 10.6 K/uL 11.3(H)  Hemoglobin 13.0 - 18.0 g/dL 14.8  Hematocrit 40.0 - 52.0 % 43.7  Platelets 150 - 440 K/uL 249      Assessment and plan- Patient is a 67 y.o. male with hereditary hemochromatosis here for a routine follow-up  Recent CBC from 09/14/2018 showed WBC of 5.9, H&H of 16/46.6 and a platelet count of 254.  Creatinine was mildly elevated at 1.2.  LFTs were normal.  Ferritin was 73.  Transferrin saturation was 49%.  Recent imaging from September 2019 did not reveal any evidence of chronic liver disease such as cirrhosis.  Goal ferritin for phlebotomy is less than 100.  His ferritin is 73 and therefore does not need a phlebotomy at this time.  We will repeat a CBC, CMP and ferritin in 3 in 6 months and I will see him back in 6 months.  I have also reviewed his recent CT scan findings with mild sigmoid diverticulitis but an endoscopy was suggested to rule out an underlying mass.  I have suggested that the patient should talk to [facility] regarding another endoscopy at this time versus patient was contemplating surgery due to repeated episodes of diverticulitis in the recent past.  He was planning to do it next year but I have asked him not to put this off until next year and get this checked out as soon as possible.  We will also alert Dr. Lisabeth Devoid as he does not have a follow-up appointment with him at this time  Patient no longer wishes to follow-up with [facility] due to the mixup with the Levaquin dose.  I also looked into guidelines for [facility] surveillance in patients with hemochromatosis.  Patient does not have underlying  cirrhosis and therefore does not need regular surveillance imaging and AFP at this time   Visit Diagnosis 1. Hereditary hemochromatosis ([place])   2. Sigmoid diverticulitis      Dr. Randa Evens, MD, MPH [facility] at [facility] [phone] 09/17/2018 8:39 AM

## 2018-12-17 ENCOUNTER — Inpatient Hospital Stay: Payer: Medicare Other | Attending: Oncology

## 2018-12-21 ENCOUNTER — Telehealth: Payer: Self-pay | Admitting: *Deleted

## 2018-12-21 NOTE — Telephone Encounter (Signed)
Can you let him know he can take multivitamin without iron. Vitamin C is ok. Thiamine and folic acid is also ok.  Thanks, Archana     Dr. Janese Banks sent me a message to let the patient know that he can take a multivitamin as long as is not got iron in it, vitamin C, thiamine, and folic acid.  Left all this info on his voicemail and gave my direct number in case he has any questions or concerns.

## 2018-12-23 ENCOUNTER — Telehealth: Payer: Self-pay | Admitting: *Deleted

## 2018-12-23 NOTE — Telephone Encounter (Signed)
Amber from [facility] called and reports that patient is still in [facility] for surgical complications and the wife called their office with questions about his medications and wanted them to update Dr Janese Banks on his condition. Amber requests a call back to discuss this. [phone]. Dr Janese Banks spoke with Luetta Nutting

## 2019-03-18 ENCOUNTER — Ambulatory Visit: Payer: Medicare Other | Admitting: Oncology
